# Patient Record
Sex: Female | Born: 1990 | Race: Asian | Hispanic: No | Marital: Single | Smoking: Never smoker
Health system: Southern US, Community
[De-identification: ages and names within clinical notes are randomized; demographics above are authoritative.]

## PROBLEM LIST (undated history)

## (undated) ENCOUNTER — Inpatient Hospital Stay (HOSPITAL_COMMUNITY): Payer: Self-pay

## (undated) DIAGNOSIS — Z789 Other specified health status: Secondary | ICD-10-CM

---

## 2014-10-04 ENCOUNTER — Ambulatory Visit (INDEPENDENT_AMBULATORY_CARE_PROVIDER_SITE_OTHER): Payer: Self-pay | Admitting: Family Medicine

## 2014-10-04 VITALS — BP 102/64 | HR 98 | Temp 98.4°F | Resp 16 | Ht 63.75 in | Wt 102.6 lb

## 2014-10-04 DIAGNOSIS — N926 Irregular menstruation, unspecified: Secondary | ICD-10-CM

## 2014-10-04 DIAGNOSIS — N910 Primary amenorrhea: Secondary | ICD-10-CM

## 2014-10-04 LAB — POCT URINE PREGNANCY: Preg Test, Ur: POSITIVE

## 2014-10-04 NOTE — Patient Instructions (Signed)
Congratulations on your pregnancy!  By your last period you are just under [redacted] weeks pregnant today and your due date is 06/07/2015.    You will want to apply for emergency medicaid to cover you for your pregnancy and provide you with pre- natal care. The [place]:  [facility]  Ms. Thurston HoleHeather Skeens, Director  [phone]  [address]  [address] ([address])  [place] [id number]  Tel. #[phone]  Fax # [phone] Courier Number: [id number]     Start taking pre- natal vitamins now, and please get a flu shot at the drug store when you can!

## 2014-10-04 NOTE — Progress Notes (Signed)
[facility] [address] [place] [address] [phone]  Date:  10/04/2014   Name:  Joy Stewart   DOB:  09-12-1991   MRN:  [id number]  PCP:  No PCP Per Patient    Chief Complaint: Possible Pregnancy and Late Period   History of Present Illness:  Joy Stewart is a 23 y.o. very pleasant female patient who presents with the following:  She is here today seeking a pregnancy test.  She has a 47 month old son; this will be her 2nd pregnancy.  Notes that her LMP was 9/3.  She has not noted any nausea or vomiting but she has noted some breast tenderness. She had a little painless spotting a few days ago.  This is now resolved and she has no current bleeding.  Never had any pain.   Her son is actually still in [place] where he was born.  He should arrive here in the [place] in the next couple of months. She has Medicaid for [place], but needs to get this transferred to [facility].   She is otherwise healthy.   NKDA She delivered via c-section last time.   Her boyfriend is the father of baby- he is here with her in the [place].  He is also the father of her living son.    There are no active problems to display for this patient.   History reviewed. No pertinent past medical history.  Past Surgical History  Procedure Laterality Date  . Cesarean section      History  Substance Use Topics  . Smoking status: Never Smoker   . Smokeless tobacco: Not on file  . Alcohol Use: No    History reviewed. No pertinent family history.  No Known Allergies  Medication list has been reviewed and updated.  No current outpatient prescriptions on file prior to visit.   No current facility-administered medications on file prior to visit.    Review of Systems:  As per HPI- otherwise negative.   Physical Examination: Filed Vitals:   10/04/14 0942  BP: 102/64  Pulse: 98  Temp: 98.4 F (36.9 C)  Resp: 16   Filed Vitals:   10/04/14 0942  Height: 5' 3.75" (1.619 m)  Weight: 102 lb 9.6 oz  (46.539 kg)   Body mass index is 17.76 kg/(m^2). Ideal Body Weight: Weight in (lb) to have BMI = 25: 144.2  GEN: WDWN, NAD, Non-toxic, A & O x 3, looks well, slim build HEENT: Atraumatic, Normocephalic. Neck supple. No masses, No LAD. Ears and Nose: No external deformity. CV: RRR, No M/G/R. No JVD. No thrill. No extra heart sounds. PULM: CTA B, no wheezes, crackles, rhonchi. No retractions. No resp. distress. No accessory muscle use. ABD: S, NT, ND. No rebound. No HSM. EXTR: No c/c/e NEURO Normal gait.  PSYCH: Normally interactive. Conversant. Not depressed or anxious appearing.  Calm demeanor.    Results for orders placed in visit on 10/04/14  POCT URINE PREGNANCY      Result Value Ref Range   Preg Test, Ur Positive      Assessment and Plan: Late menstruation - Plan: POCT urine pregnancy  Early pregnancy- she is 4 and 6 by her LMP.  Discussed how to get her medicaid transferred to [place] so she will have coverage for her pregnancy.  Start PNV and get a flu shot at the drug store (to save money).   If any bleeding, pain, or other complications I instructed [facility] to [facility]   Signed Abbe AmsterdamJessica Clerance Umland, MD

## 2014-10-15 ENCOUNTER — Emergency Department (HOSPITAL_COMMUNITY): Payer: Medicaid Other

## 2014-10-15 ENCOUNTER — Encounter (HOSPITAL_COMMUNITY): Payer: Self-pay | Admitting: Emergency Medicine

## 2014-10-15 ENCOUNTER — Emergency Department (HOSPITAL_COMMUNITY)
Admission: EM | Admit: 2014-10-15 | Discharge: 2014-10-15 | Disposition: A | Discharge status: Home / Self Care | Payer: Medicaid Other | Attending: Emergency Medicine | Admitting: Emergency Medicine

## 2014-10-15 DIAGNOSIS — O209 Hemorrhage in early pregnancy, unspecified: Secondary | ICD-10-CM | POA: Diagnosis present

## 2014-10-15 DIAGNOSIS — O200 Threatened abortion: Secondary | ICD-10-CM | POA: Diagnosis not present

## 2014-10-15 DIAGNOSIS — Z3A01 Less than 8 weeks gestation of pregnancy: Secondary | ICD-10-CM | POA: Insufficient documentation

## 2014-10-15 LAB — BASIC METABOLIC PANEL
Anion gap: 13 (ref 5–15)
BUN: 11 mg/dL (ref 6–23)
CALCIUM: 9 mg/dL (ref 8.4–10.5)
CO2: 25 meq/L (ref 19–32)
CREATININE: 0.48 mg/dL — AB (ref 0.50–1.10)
Chloride: 103 mEq/L (ref 96–112)
GFR calc Af Amer: >90 mL/min (ref >90)
GLUCOSE: 82 mg/dL (ref 70–99)
Potassium: 4.2 mEq/L (ref 3.7–5.3)
Sodium: 141 mEq/L (ref 137–147)

## 2014-10-15 LAB — CBC WITH DIFFERENTIAL/PLATELET
Basophils Absolute: 0 10*3/uL (ref 0.0–0.1)
Basophils Relative: 0 % (ref 0–1)
EOS PCT: 1 % (ref 0–5)
Eosinophils Absolute: 0.1 10*3/uL (ref 0.0–0.7)
HEMATOCRIT: 37.7 % (ref 36.0–46.0)
Hemoglobin: 12.8 g/dL (ref 12.0–15.0)
LYMPHS ABS: 1.6 10*3/uL (ref 0.7–4.0)
LYMPHS PCT: 30 % (ref 12–46)
MCH: 31.5 pg (ref 26.0–34.0)
MCHC: 34 g/dL (ref 30.0–36.0)
MCV: 92.9 fL (ref 78.0–100.0)
MONO ABS: 0.5 10*3/uL (ref 0.1–1.0)
MONOS PCT: 9 % (ref 3–12)
Neutro Abs: 3.1 10*3/uL (ref 1.7–7.7)
Neutrophils Relative %: 60 % (ref 43–77)
Platelets: 243 10*3/uL (ref 150–400)
RBC: 4.06 MIL/uL (ref 3.87–5.11)
RDW: 12.1 % (ref 11.5–15.5)
WBC: 5.3 10*3/uL (ref 4.0–10.5)

## 2014-10-15 LAB — HIV ANTIBODY (ROUTINE TESTING W REFLEX): HIV 1&2 Ab, 4th Generation: NONREACTIVE

## 2014-10-15 LAB — WET PREP, GENITAL
Clue Cells Wet Prep HPF POC: NONE SEEN
Trich, Wet Prep: NONE SEEN
WBC WET PREP: NONE SEEN
YEAST WET PREP: NONE SEEN

## 2014-10-15 LAB — ABO/RH: ABO/RH(D): A POS

## 2014-10-15 LAB — HCG, QUANTITATIVE, PREGNANCY: HCG, BETA CHAIN, QUANT, S: 962 m[IU]/mL — AB (ref <5)

## 2014-10-15 MED ORDER — ACETAMINOPHEN 325 MG PO TABS
650.0000 mg | ORAL_TABLET | Freq: Once | ORAL | Status: AC
  Administered 2014-10-15: 650 mg via ORAL
  Filled 2014-10-15: qty 2

## 2014-10-15 NOTE — ED Provider Notes (Signed)
CSN: [id number]     Arrival date & time 10/15/14  1426 History   First MD Initiated Contact with Patient 10/15/14 1457     Chief Complaint  Patient presents with  . 6 wks preg, vaginal bleeding      (Consider location/radiation/quality/duration/timing/severity/associated sxs/prior Treatment) Patient is a 23 y.o. female presenting with vaginal bleeding. The history is provided by the patient. No language interpreter was used.  Vaginal Bleeding Quality:  Dark red and typical of menses Severity:  Moderate Onset quality:  Sudden Duration:  15 hours Timing:  Constant Progression:  Unchanged Chronicity:  New Possible pregnancy: yes (6-7 weeks by LMP, no US yet)   Context: spontaneously   Relieved by:  Nothing Worsened by:  Nothing tried Ineffective treatments:  None tried Associated symptoms: abdominal pain   Associated symptoms: no back pain, no dizziness, no dysuria, no fatigue, no fever, no nausea and no vaginal discharge   Abdominal pain:    Location:  Suprapubic   Quality:  Aching and cramping   Severity:  Moderate   Onset quality:  Sudden   Duration:  15 hours   Timing:  Constant   Progression:  Unchanged   Chronicity:  New Risk factors: no hx of ectopic pregnancy and no prior miscarriage     History reviewed. No pertinent past medical history. Past Surgical History  Procedure Laterality Date  . Cesarean section     History reviewed. No pertinent family history. History  Substance Use Topics  . Smoking status: Never Smoker   . Smokeless tobacco: Not on file  . Alcohol Use: No   OB History   Grav Para Term Preterm Abortions TAB SAB Ect Mult Living                 Review of Systems  Constitutional: Negative for fever, chills, diaphoresis, activity change, appetite change and fatigue.  HENT: Negative for congestion, facial swelling, rhinorrhea and sore throat.   Eyes: Negative for photophobia and discharge.  Respiratory: Negative for cough, chest tightness and  shortness of breath.   Cardiovascular: Negative for chest pain, palpitations and leg swelling.  Gastrointestinal: Positive for abdominal pain. Negative for nausea, vomiting and diarrhea.  Endocrine: Negative for polydipsia and polyuria.  Genitourinary: Positive for vaginal bleeding. Negative for dysuria, frequency, vaginal discharge, difficulty urinating and pelvic pain.  Musculoskeletal: Negative for arthralgias, back pain, neck pain and neck stiffness.  Skin: Negative for color change and wound.  Allergic/Immunologic: Negative for immunocompromised state.  Neurological: Negative for dizziness, facial asymmetry, weakness, numbness and headaches.  Hematological: Does not bruise/bleed easily.  Psychiatric/Behavioral: Negative for confusion and agitation.      Allergies  Review of patient's allergies indicates no known allergies.  Home Medications   Prior to Admission medications   Not on File   BP 98/58  Pulse 83  Temp(Src) 98.2 F (36.8 C) (Oral)  Resp 18  SpO2 99% Physical Exam  Constitutional: She is oriented to person, place, and time. She appears well-developed and well-nourished. No distress.  HENT:  Head: Normocephalic and atraumatic.  Mouth/Throat: No oropharyngeal exudate.  Eyes: Pupils are equal, round, and reactive to light.  Neck: Normal range of motion. Neck supple.  Cardiovascular: Normal rate, regular rhythm and normal heart sounds.  Exam reveals no gallop and no friction rub.   No murmur heard. Pulmonary/Chest: Effort normal and breath sounds normal. No respiratory distress. She has no wheezes. She has no rales.  Abdominal: Soft. Bowel sounds are normal. She exhibits no  distension and no mass. There is no tenderness. There is no rebound and no guarding.  Genitourinary: Right adnexum displays tenderness. Left adnexum displays no mass, no tenderness and no fullness.  Os open with material in the os, moderate dark blood in the vault  Musculoskeletal: Normal range  of motion. She exhibits no edema and no tenderness.  Neurological: She is alert and oriented to person, place, and time.  Skin: Skin is warm and dry.  Psychiatric: She has a normal mood and affect.    ED Course  Procedures (including critical care time) Labs Review Labs Reviewed  BASIC METABOLIC PANEL - Abnormal; Notable for the following:    Creatinine, Ser 0.48 (*)    All other components within normal limits  HCG, QUANTITATIVE, PREGNANCY - Abnormal; Notable for the following:    hCG, Beta Chain, Quant, S 962 (*)    All other components within normal limits  WET PREP, GENITAL  URINE CULTURE  GC/CHLAMYDIA PROBE AMP  CBC WITH DIFFERENTIAL  URINALYSIS, ROUTINE W REFLEX MICROSCOPIC  HIV ANTIBODY (ROUTINE TESTING)  POC URINE PREG, ED  ABO/RH    Imaging Review [place] Ob Comp Less 14 Wks  10/15/2014   CLINICAL DATA:  Early pregnancy with bleeding.  EXAM: OBSTETRIC <14 WK US AND TRANSVAGINAL OB US  TECHNIQUE: Both transabdominal and transvaginal ultrasound examinations were performed for complete evaluation of the gestation as well as the maternal uterus, adnexal regions, and pelvic cul-de-sac. Transvaginal technique was performed to assess early pregnancy.  COMPARISON:  None.  FINDINGS: Intrauterine gestational sac: Single and grossly normal in appearance  Yolk sac:  Not visualized  Embryo:  Not visualized  MSD:  5.9  mm   5 w   2  d  US EDC: 06/15/2015  Maternal uterus/adnexae: No evidence of subchorionic hemorrhage. Right ovary 3.6 x 2.2 x 2.7 cm, normal. Left ovary 3.5 x 1.7 x 2.1 cm, normal. Normal appearing follicular cysts. No free fluid.  IMPRESSION: Normal appearing intrauterine gestational sac, 5 weeks 2 days by mean sac diameter. No visible yolk sac or fetal pole on today's exam.   Electronically Signed   By: Paulina FusiMark  Shogry M.D.   On: 10/15/2014 18:55   [place] Transvaginal Non-ob  10/15/2014   CLINICAL DATA:  Early pregnancy with bleeding.  EXAM: OBSTETRIC <14 WK US AND TRANSVAGINAL OB US   TECHNIQUE: Both transabdominal and transvaginal ultrasound examinations were performed for complete evaluation of the gestation as well as the maternal uterus, adnexal regions, and pelvic cul-de-sac. Transvaginal technique was performed to assess early pregnancy.  COMPARISON:  None.  FINDINGS: Intrauterine gestational sac: Single and grossly normal in appearance  Yolk sac:  Not visualized  Embryo:  Not visualized  MSD:  5.9  mm   5 w   2  d  US EDC: 06/15/2015  Maternal uterus/adnexae: No evidence of subchorionic hemorrhage. Right ovary 3.6 x 2.2 x 2.7 cm, normal. Left ovary 3.5 x 1.7 x 2.1 cm, normal. Normal appearing follicular cysts. No free fluid.  IMPRESSION: Normal appearing intrauterine gestational sac, 5 weeks 2 days by mean sac diameter. No visible yolk sac or fetal pole on today's exam.   Electronically Signed   By: Paulina FusiMark  Shogry M.D.   On: 10/15/2014 18:55     EKG Interpretation None      MDM   Final diagnoses:  Threatened abortion    Pt is a 23 y.o. female with Pmhx as above who presents with vaginal bleeding and crampy low abdominal pain  since about 1 AM this morning. Patient is approximately 6-[redacted] weeks pregnant by last initial period. She has seen OB on has not yet had an ultrasound. She denies fever, chills, nausea, vomiting, dysuria. On physical exam vital signs are stable and she is in no acute distress. Pelvic exam she has moderate amount of dark blood in vaginal vault. Os is open and there is bacterial in office. Exam is consistent with a inevitable spontaneous abortion. Pelvic ultrasound with yolk sac, no fetal pole, at [redacted]W[redacted]D, Rh+. Again, clinically I suspect inevitable abortion, but given only yolk sac seen, will have her f/u with her OB or women's clinic in 2 days. Return precautions given for new or worsening symptoms including worsening pain, fever. Bleeding precautions also given.          Toy Cookey, MD 10/15/14 [phone]

## 2014-10-15 NOTE — ED Notes (Signed)
Pt reminded of need for urine sample, still unable to provide sample at this time.

## 2014-10-15 NOTE — Discharge Instructions (Signed)
Threatened Miscarriage °A threatened miscarriage is when you have vaginal bleeding during your first 20 weeks of pregnancy but the pregnancy has not ended. Your doctor will do tests to make sure you are still pregnant. The cause of the bleeding may not be known. This condition does not mean your pregnancy will end. It does increase the risk of it ending (complete miscarriage). °HOME CARE  °· Make sure you keep all your doctor visits for prenatal care. °· Get plenty of rest. °· Do not have sex or use tampons if you have vaginal bleeding. °· Do not douche. °· Do not smoke or use drugs. °· Do not drink alcohol. °· Avoid caffeine. °GET HELP IF: °· You have light bleeding from your vagina. °· You have belly pain or cramping. °· You have a fever. °GET HELP RIGHT AWAY IF:  °· You have heavy bleeding from your vagina. °· You have clots of blood coming from your vagina. °· You have bad pain or cramps in your low back or belly. °· You have fever, chills, and bad belly pain. °MAKE SURE YOU:  °· Understand these instructions. °· Will watch your condition. °· Will get help right away if you are not doing well or get worse. °Document Released: 11/27/2008 Document Revised: 12/20/2013 Document Reviewed: 10/11/2013 °ExitCare® Patient Information ©2015 [employer]. This information is not intended to replace advice given to you by your health care provider. Make sure you discuss any questions you have with your health care provider. ° °

## 2014-10-15 NOTE — ED Notes (Signed)
Pt reports 2 pregnancy, 1 living child. Reports she is 5-[redacted] weeks pregnant, started having vaginal bleeding around 1400. Abdominal pain/cramps 7/10.

## 2014-10-15 NOTE — ED Notes (Signed)
Patient transported to Ultrasound 

## 2014-10-16 LAB — GC/CHLAMYDIA PROBE AMP
CT Probe RNA: NEGATIVE
GC Probe RNA: NEGATIVE

## 2014-10-17 ENCOUNTER — Inpatient Hospital Stay (HOSPITAL_COMMUNITY)
Admission: AD | Admit: 2014-10-17 | Discharge: 2014-10-17 | Disposition: A | Discharge status: Home / Self Care | Payer: Medicaid Other | Source: Ambulatory Visit | Attending: Obstetrics & Gynecology | Admitting: Obstetrics & Gynecology

## 2014-10-17 ENCOUNTER — Encounter (HOSPITAL_COMMUNITY): Payer: Self-pay | Admitting: *Deleted

## 2014-10-17 DIAGNOSIS — Z3A01 Less than 8 weeks gestation of pregnancy: Secondary | ICD-10-CM | POA: Diagnosis not present

## 2014-10-17 DIAGNOSIS — R109 Unspecified abdominal pain: Secondary | ICD-10-CM | POA: Diagnosis present

## 2014-10-17 DIAGNOSIS — O039 Complete or unspecified spontaneous abortion without complication: Secondary | ICD-10-CM | POA: Insufficient documentation

## 2014-10-17 HISTORY — DX: Other specified health status: Z78.9

## 2014-10-17 LAB — URINALYSIS, ROUTINE W REFLEX MICROSCOPIC
Bilirubin Urine: NEGATIVE
Glucose, UA: NEGATIVE mg/dL
Ketones, ur: NEGATIVE mg/dL
LEUKOCYTES UA: NEGATIVE
NITRITE: NEGATIVE
PH: 6 (ref 5.0–8.0)
Protein, ur: NEGATIVE mg/dL
SPECIFIC GRAVITY, URINE: 1.02 (ref 1.005–1.030)
UROBILINOGEN UA: 0.2 mg/dL (ref 0.0–1.0)

## 2014-10-17 LAB — URINE MICROSCOPIC-ADD ON

## 2014-10-17 LAB — CBC
HCT: 37.1 % (ref 36.0–46.0)
HEMOGLOBIN: 12.6 g/dL (ref 12.0–15.0)
MCH: 31.2 pg (ref 26.0–34.0)
MCHC: 34 g/dL (ref 30.0–36.0)
MCV: 91.8 fL (ref 78.0–100.0)
PLATELETS: 223 10*3/uL (ref 150–400)
RBC: 4.04 MIL/uL (ref 3.87–5.11)
RDW: 12 % (ref 11.5–15.5)
WBC: 5 10*3/uL (ref 4.0–10.5)

## 2014-10-17 LAB — HCG, QUANTITATIVE, PREGNANCY: hCG, Beta Chain, Quant, S: 266 m[IU]/mL — ABNORMAL HIGH (ref <5)

## 2014-10-17 NOTE — MAU Provider Note (Signed)
History     CSN: [id number]  Arrival date and time: 10/17/14 1101   First Provider Initiated Contact with Patient 10/17/14 1141      Chief Complaint  Patient presents with  . Vaginal Bleeding  . Abdominal Pain   Vaginal Bleeding Associated symptoms include headaches. Pertinent negatives include no abdominal pain.  Abdominal Pain Associated symptoms include headaches.    Pt is a G2P1001 at 3011w4d wks IUP here with report of increased abdominal pain and bleeding yesterday.  Pt reports passing a clot yesterday.  Pain and bleeding has decreased today.   [facility] for pelvic pain and bleeding.  Ultrasound showed an IUGS, but not yolk sac.  BHCG was 962.    Past Medical History  Diagnosis Date  . Medical history non-contributory     Past Surgical History  Procedure Laterality Date  . Cesarean section      Family History  Problem Relation Age of Onset  . Hypertension Father     History  Substance Use Topics  . Smoking status: Never Smoker   . Smokeless tobacco: Not on file  . Alcohol Use: No    Allergies: No Known Allergies  No prescriptions prior to admission    Review of Systems  Gastrointestinal: Negative for abdominal pain.  Genitourinary: Positive for vaginal bleeding.       Vaginal bleeding  Neurological: Positive for headaches.  All other systems reviewed and are negative.  Physical Exam   Blood pressure 111/65, pulse 86, temperature 97.7 F (36.5 C), resp. rate 18, height 5' 3.5" (1.613 m), weight 45.632 kg (100 lb 9.6 oz).  Physical Exam  Constitutional: She is oriented to person, place, and time. She appears well-developed and well-nourished. No distress.  HENT:  Head: Normocephalic.  Neck: Normal range of motion. Neck supple.  Cardiovascular: Normal rate, regular rhythm and normal heart sounds.   Respiratory: Effort normal and breath sounds normal. No respiratory distress.  GI: Soft. There is no tenderness.   Genitourinary: Uterus is enlarged. Right adnexum displays no mass, no tenderness and no fullness. Left adnexum displays no mass, no tenderness and no fullness. There is bleeding (scant) around the vagina.  Musculoskeletal: Normal range of motion.  Neurological: She is alert and oriented to person, place, and time.  Skin: Skin is warm and dry.    MAU Course  Procedures  Results for orders placed during the hospital encounter of 10/17/14 (from the past 24 hour(s))  URINALYSIS, ROUTINE W REFLEX MICROSCOPIC     Status: Abnormal   Collection Time    10/17/14 11:15 AM      Result Value Ref Range   Color, Urine YELLOW  YELLOW   APPearance HAZY (*) CLEAR   Specific Gravity, Urine 1.020  1.005 - 1.030   pH 6.0  5.0 - 8.0   Glucose, UA NEGATIVE  NEGATIVE mg/dL   Hgb urine dipstick LARGE (*) NEGATIVE   Bilirubin Urine NEGATIVE  NEGATIVE   Ketones, ur NEGATIVE  NEGATIVE mg/dL   Protein, ur NEGATIVE  NEGATIVE mg/dL   Urobilinogen, UA 0.2  0.0 - 1.0 mg/dL   Nitrite NEGATIVE  NEGATIVE   Leukocytes, UA NEGATIVE  NEGATIVE  URINE MICROSCOPIC-ADD ON     Status: Abnormal   Collection Time    10/17/14 11:15 AM      Result Value Ref Range   Squamous Epithelial / LPF FEW (*) RARE   RBC / HPF 21-50  <3 RBC/hpf   Bacteria,  UA FEW (*) RARE   Urine-Other MUCOUS PRESENT    CBC     Status: None   Collection Time    10/17/14 11:35 AM      Result Value Ref Range   WBC 5.0  4.0 - 10.5 K/uL   RBC 4.04  3.87 - 5.11 MIL/uL   Hemoglobin 12.6  12.0 - 15.0 g/dL   HCT 78.237.1  95.636.0 - 21.346.0 %   MCV 91.8  78.0 - 100.0 fL   MCH 31.2  26.0 - 34.0 pg   MCHC 34.0  30.0 - 36.0 g/dL   RDW 08.612.0  57.811.5 - 46.915.5 %   Platelets 223  150 - 400 K/uL  HCG, QUANTITATIVE, PREGNANCY     Status: Abnormal   Collection Time    10/17/14 11:35 AM      Result Value Ref Range   hCG, Beta Chain, Quant, S 266 (*) <5 mIU/mL    Assessment and Plan  Spontaneous Abortion  Plan: Consulted with Dr. Macon LargeAnyanwu > reviewed HPI/exam/labs >  repeat BHCG in one week in clinic  Reviewed bleeding/ectopic precautions Follow-up in clinic in one week  KARIM, [facility] Home[facility] N 10/17/2014, 11:45 AM

## 2014-10-17 NOTE — Discharge Instructions (Signed)
Miscarriage °A miscarriage is the loss of an unborn baby (fetus) before the 20th week of pregnancy. The cause is often unknown.  °HOME CARE °· You may need to stay in bed (bed rest), or you may be able to do light activity. Go about activity as told by your doctor. °· Have help at home. °· Write down how many pads you use each day. Write down how soaked they are. °· Do not use tampons. Do not wash out your vagina (douche) or have sex (intercourse) until your doctor approves. °· Only take medicine as told by your doctor. °· Do not take aspirin. °· Keep all doctor visits as told. °· If you or your partner have problems with grieving, talk to your doctor. You can also try counseling. Give yourself time to grieve before trying to get pregnant again. °GET HELP RIGHT AWAY IF: °· You have bad cramps or pain in your back or belly (abdomen). °· You have a fever. °· You pass large clumps of blood (clots) from your vagina that are walnut-sized or larger. Save the clumps for your doctor to see. °· You pass large amounts of tissue from your vagina. Save the tissue for your doctor to see. °· You have more bleeding. °· You have thick, bad-smelling fluid (discharge) coming from the vagina. °· You get lightheaded, weak, or you pass out (faint). °· You have chills. °MAKE SURE YOU: °· Understand these instructions. °· Will watch your condition. °· Will get help right away if you are not doing well or get worse. °Document Released: 03/08/2012 Document Reviewed: 03/08/2012 °ExitCare® Patient Information ©2015 Exit[employer], [employer]. This information is not intended to replace advice given to you by your health care provider. Make sure you discuss any questions you have with your health care provider. ° °

## 2014-10-17 NOTE — MAU Note (Signed)
E -sig not working and computer shut down. Pt was unable to sign.

## 2014-10-17 NOTE — Progress Notes (Signed)
W.Karim CNM in to discuss test results and d/c plan. Written and verbal d/c instructions given and understanding voiced. Comfort info. Given with pillow.

## 2014-10-17 NOTE — MAU Provider Note (Signed)
Attestation of Attending Supervision of Advanced Practitioner (PA/CNM/NP): Evaluation and management procedures were performed by the Advanced Practitioner under my supervision and collaboration.  I have reviewed the Advanced Practitioner's note and chart, and I agree with the management and plan.  Freda Jaquith, MD, FACOG Attending Obstetrician & Gynecologist Faculty Practice, [facility]   

## 2014-10-17 NOTE — MAU Note (Signed)
Was here 2 days ago. Cont to have vag bleeding and abd pain. Abd cramping that comes and goes. Bleeding is dark red

## 2014-10-17 NOTE — Progress Notes (Signed)
Spec exam only 

## 2014-10-24 ENCOUNTER — Other Ambulatory Visit: Payer: Medicaid Other | Admitting: General Practice

## 2014-10-24 ENCOUNTER — Telehealth: Payer: Self-pay

## 2014-10-24 DIAGNOSIS — O039 Complete or unspecified spontaneous abortion without complication: Secondary | ICD-10-CM

## 2014-10-24 LAB — HCG, QUANTITATIVE, PREGNANCY: hCG, Beta Chain, Quant, S: 10.11 m[IU]/mL

## 2014-10-24 NOTE — Progress Notes (Signed)
Patient here today for stat b-hcg for follow up of spontaneous miscarriage. Seen in MAU on 10/20. Patient denies bleeding or pain since then. Informed patient we will call her with results and any follow up if needed. Patient verbalizes understanding of plan

## 2014-10-24 NOTE — Telephone Encounter (Signed)
Received call from [facility] reporting stat HCG of 10.11. Discussed results with Rochele PagesWalidah Karim, CNM who states patient should return in 1 week for repeat HCG. Called patient and informed her of results. Patient to return in 1 week November 3 at 0900. Message sent to admin pool to add to lab schedule.

## 2014-10-31 ENCOUNTER — Encounter (HOSPITAL_COMMUNITY): Payer: Self-pay | Admitting: *Deleted

## 2014-10-31 ENCOUNTER — Other Ambulatory Visit: Payer: Medicaid Other

## 2014-10-31 ENCOUNTER — Telehealth: Payer: Self-pay

## 2014-10-31 NOTE — Telephone Encounter (Signed)
Patient here for lab draw today-- was supposed to have repeat HCG but 3hr gtt was done instead. Attempted to contact patient to return for repeat HCG. No voicemail box set up. Unable to leave message.

## 2014-11-01 NOTE — Telephone Encounter (Signed)
Called patient and apologized and informed her of the mistake that the wrong lab had been drawn yesterday. Patient verbalized understanding and stated that she is out of town and will be back Monday. States she can come in Monday morning at 1000 for repeat HCG. Informed patient we would add her to the schedule. Patient verbalized understanding and gratitude. NO questions or concerns.

## 2014-11-06 ENCOUNTER — Other Ambulatory Visit: Payer: Medicaid Other

## 2014-11-06 DIAGNOSIS — N939 Abnormal uterine and vaginal bleeding, unspecified: Secondary | ICD-10-CM

## 2014-11-07 LAB — HCG, QUANTITATIVE, PREGNANCY: hCG, Beta Chain, Quant, S: <2 m[IU]/mL

## 2014-11-08 ENCOUNTER — Telehealth: Payer: Self-pay

## 2014-11-08 NOTE — Telephone Encounter (Signed)
-----   Message from Tereso NewcomerUgonna A Anyanwu, MD sent at 11/08/2014  8:27 AM EST ----- Completed SAB, no further lab draws needed. Can have an appointment with us or Health Department for annual exam and to discuss contraception if desired.

## 2014-11-08 NOTE — Telephone Encounter (Signed)
Called patient with results. Patient verbalized understanding and states she does not desire birth control at this time and would not like to schedule an appointment. Advised that should she decide she wants to she can schedule an appointment at the health department or clinic. Patient verbalized understanding and gratitude. No questions or concerns.

## 2015-01-18 IMAGING — US US TRANSVAGINAL NON-OB
1 series · 14 of 25 positions shown · non-contrast
Comparison: None.

CLINICAL DATA: Early pregnancy with bleeding.

EXAM:
OBSTETRIC <14 WK US AND TRANSVAGINAL OB US
TECHNIQUE: Both transabdominal and transvaginal ultrasound examinations were
performed for complete evaluation of the gestation as well as the
maternal uterus, adnexal regions, and pelvic cul-de-sac.
Transvaginal technique was performed to assess early pregnancy.

[Series 1: us transvaginal non-ob · 0.22mm/px · 14 of 52 slices shown]
[im 1/52]
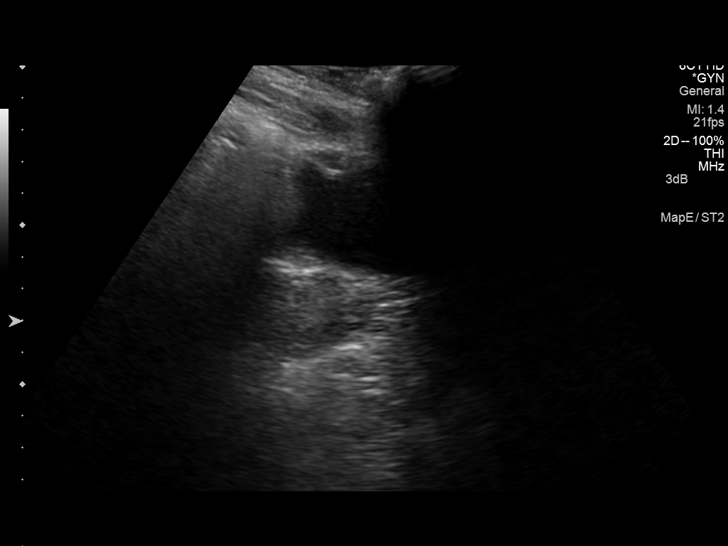
[im 5/52]
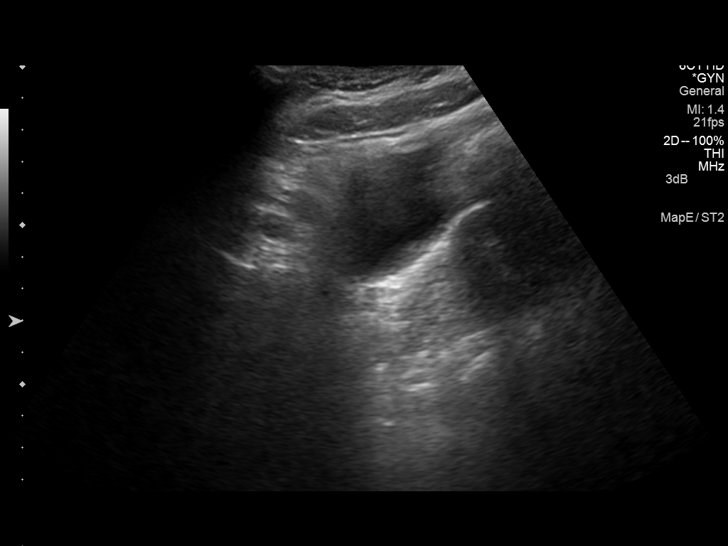
[im 9/52]
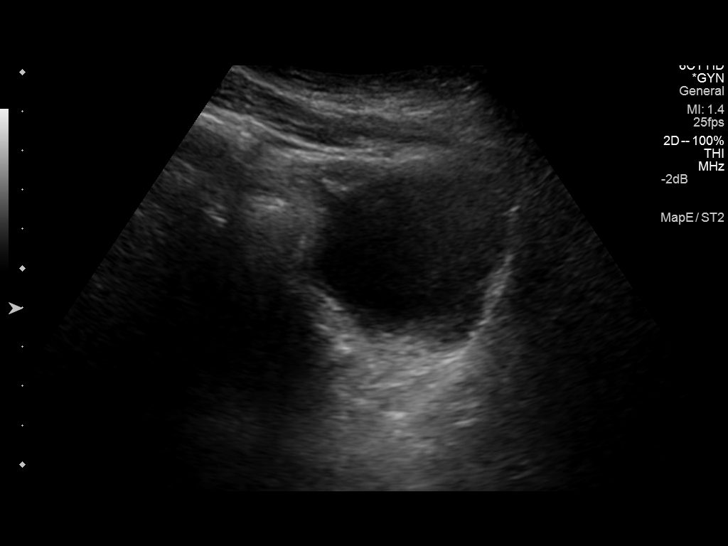
[im 13/52]
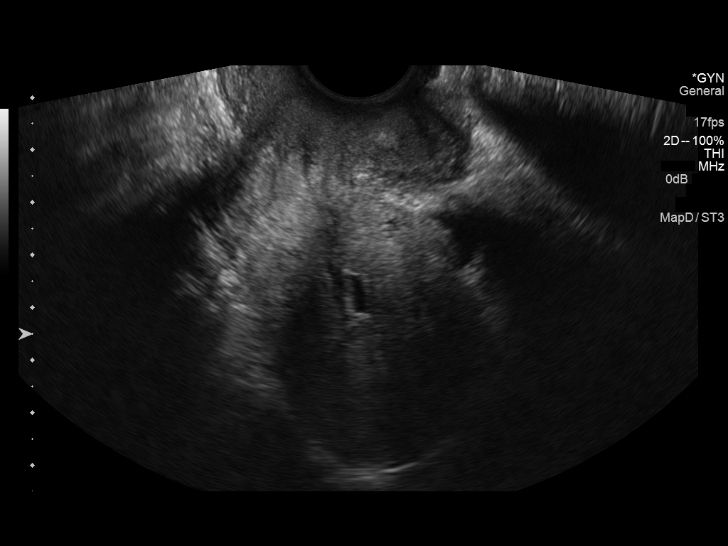
[im 18/52]
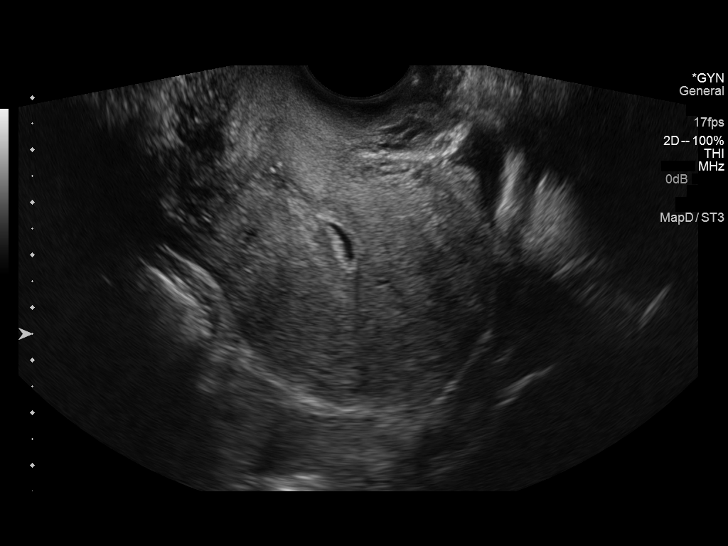
[im 20/52]
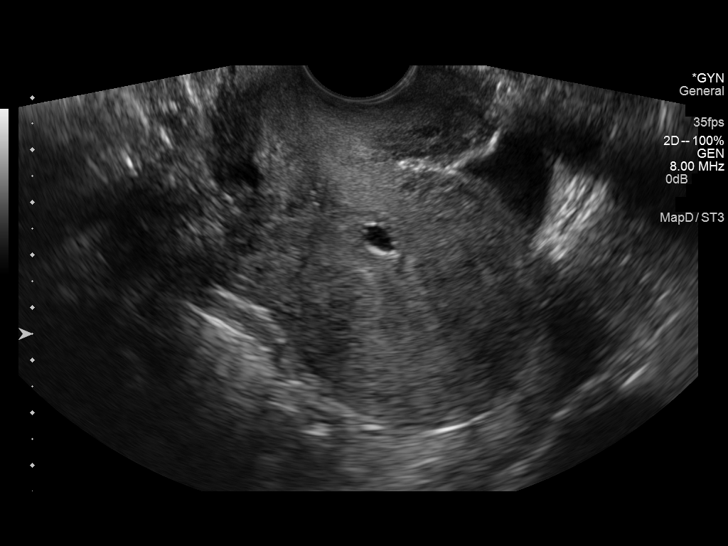
[im 24/52]
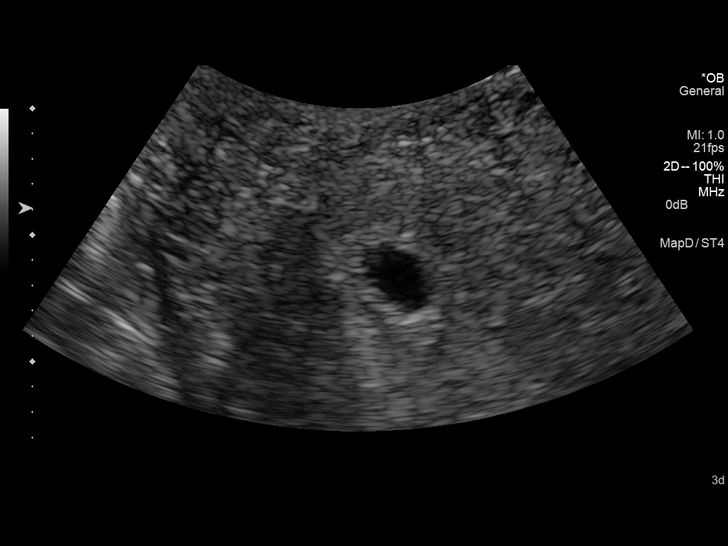
[im 28/52]
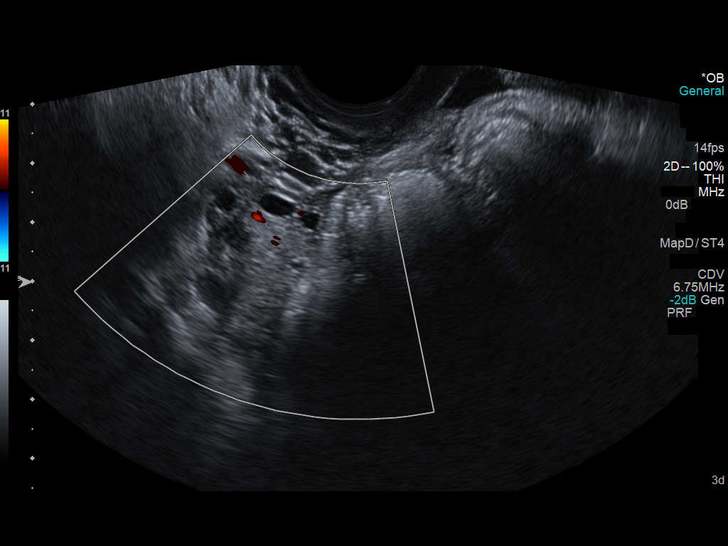
[im 32/52]
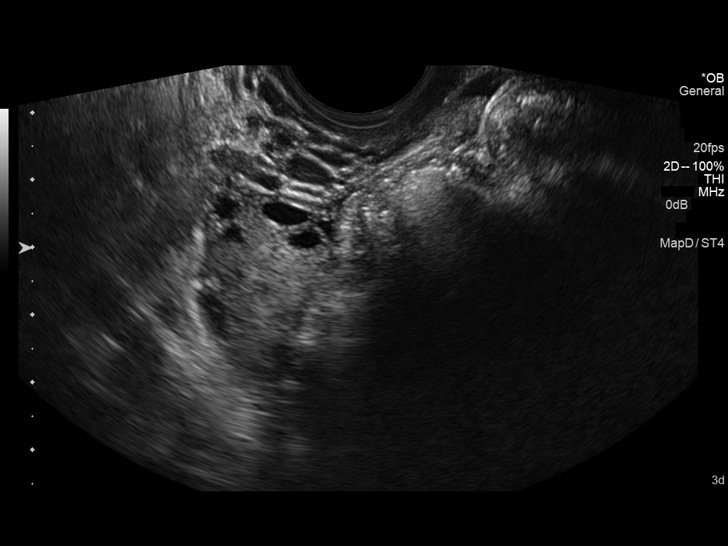
[im 35/52]
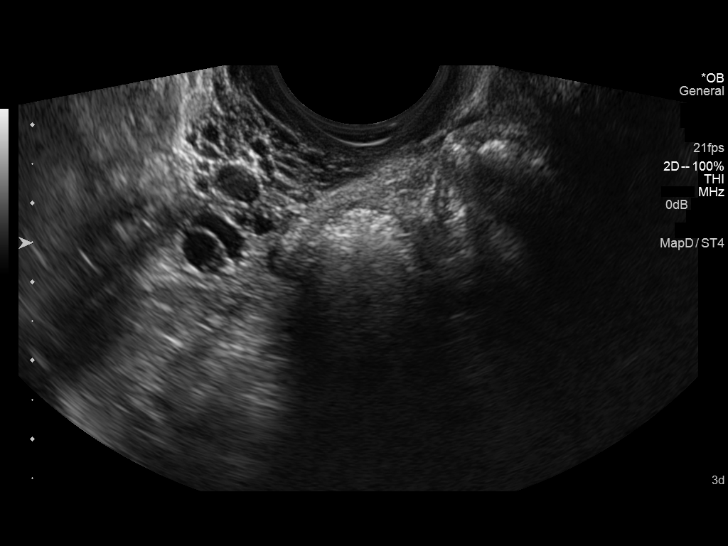
[im 39/52]
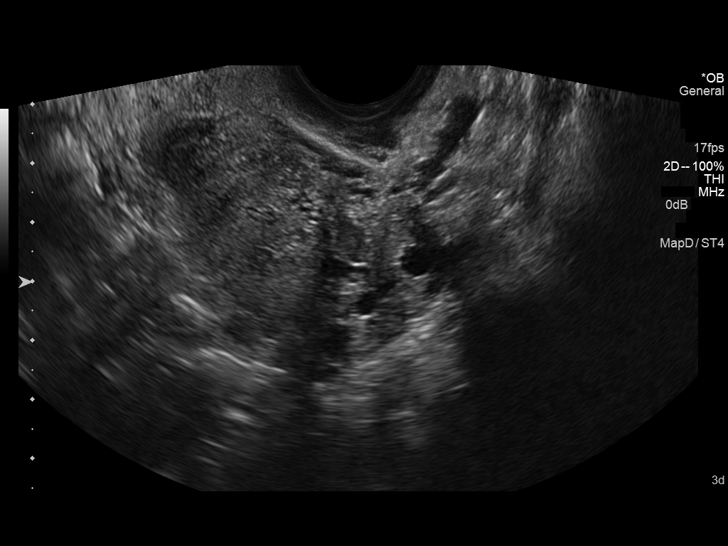
[im 43/52]
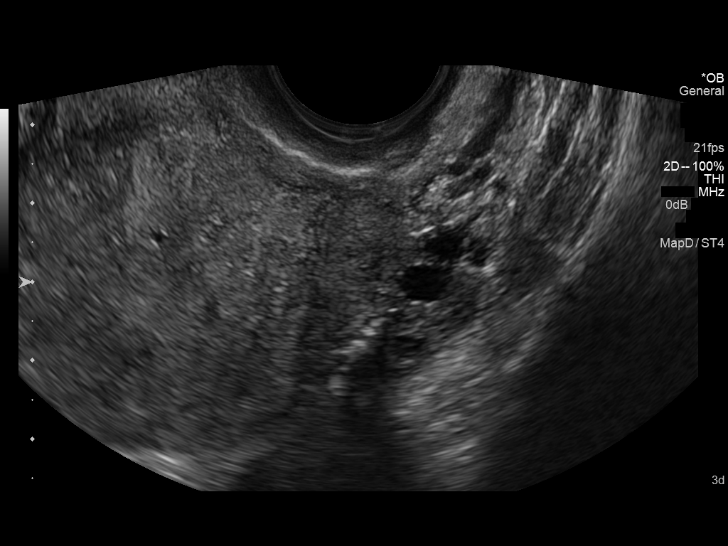
[im 47/52]
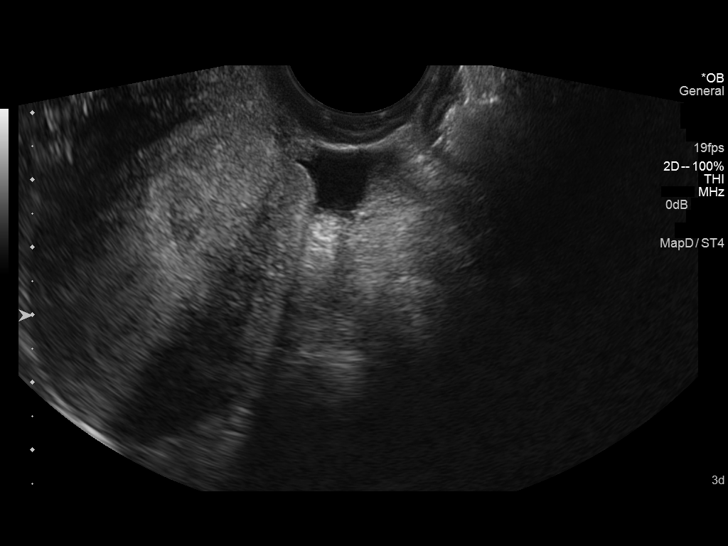
[im 52/52]
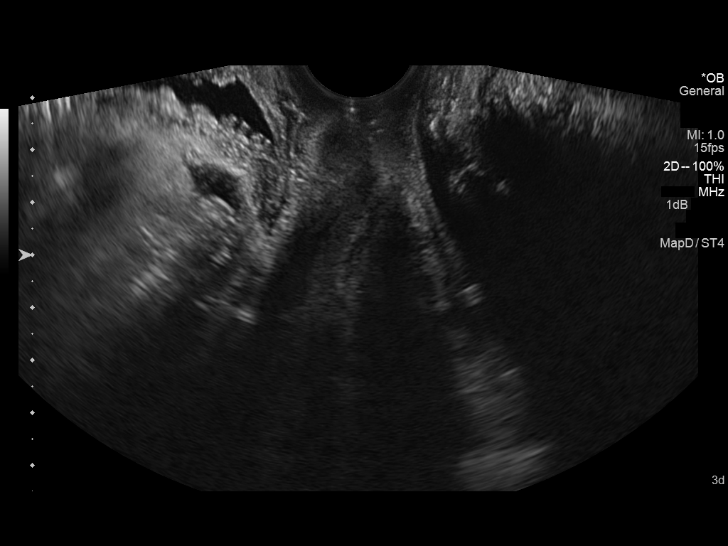

[14 of 25 positions shown; findings below may reference images not displayed]

FINDINGS: Intrauterine gestational sac: Single and grossly normal in
appearance

Yolk sac:  Not visualized

Embryo:  Not visualized

MSD:  5.9  mm   5 w   2  d

US EDC: 06/15/2015

Maternal uterus/adnexae: No evidence of subchorionic hemorrhage.
Right ovary 3.6 x 2.2 x 2.7 cm, normal. Left ovary 3.5 x 1.7 x
cm, normal. Normal appearing follicular cysts. No free fluid.
IMPRESSION: Normal appearing intrauterine gestational sac, 5 weeks 2 days by
mean sac diameter. No visible yolk sac or fetal pole on today's
exam.

## 2015-08-21 ENCOUNTER — Encounter (HOSPITAL_COMMUNITY): Payer: Self-pay | Admitting: *Deleted

## 2015-08-21 ENCOUNTER — Inpatient Hospital Stay (HOSPITAL_COMMUNITY): Payer: Medicaid Other

## 2015-08-21 ENCOUNTER — Inpatient Hospital Stay (HOSPITAL_COMMUNITY)
Admission: AD | Admit: 2015-08-21 | Discharge: 2015-08-21 | Disposition: A | Discharge status: Home / Self Care | Payer: Medicaid Other | Source: Ambulatory Visit | Attending: Obstetrics & Gynecology | Admitting: Obstetrics & Gynecology

## 2015-08-21 DIAGNOSIS — O26891 Other specified pregnancy related conditions, first trimester: Secondary | ICD-10-CM | POA: Diagnosis present

## 2015-08-21 DIAGNOSIS — O26851 Spotting complicating pregnancy, first trimester: Secondary | ICD-10-CM | POA: Diagnosis not present

## 2015-08-21 DIAGNOSIS — R1030 Lower abdominal pain, unspecified: Secondary | ICD-10-CM | POA: Diagnosis not present

## 2015-08-21 DIAGNOSIS — O9989 Other specified diseases and conditions complicating pregnancy, childbirth and the puerperium: Secondary | ICD-10-CM

## 2015-08-21 DIAGNOSIS — O26899 Other specified pregnancy related conditions, unspecified trimester: Secondary | ICD-10-CM

## 2015-08-21 DIAGNOSIS — O3680X0 Pregnancy with inconclusive fetal viability, not applicable or unspecified: Secondary | ICD-10-CM

## 2015-08-21 DIAGNOSIS — Z3A01 Less than 8 weeks gestation of pregnancy: Secondary | ICD-10-CM | POA: Insufficient documentation

## 2015-08-21 DIAGNOSIS — R109 Unspecified abdominal pain: Secondary | ICD-10-CM

## 2015-08-21 LAB — CBC
HEMATOCRIT: 36.8 % (ref 36.0–46.0)
Hemoglobin: 12.5 g/dL (ref 12.0–15.0)
MCH: 31.3 pg (ref 26.0–34.0)
MCHC: 34 g/dL (ref 30.0–36.0)
MCV: 92.2 fL (ref 78.0–100.0)
Platelets: 234 10*3/uL (ref 150–400)
RBC: 3.99 MIL/uL (ref 3.87–5.11)
RDW: 12.4 % (ref 11.5–15.5)
WBC: 7 10*3/uL (ref 4.0–10.5)

## 2015-08-21 LAB — URINALYSIS, ROUTINE W REFLEX MICROSCOPIC
BILIRUBIN URINE: NEGATIVE
Glucose, UA: NEGATIVE mg/dL
KETONES UR: NEGATIVE mg/dL
Leukocytes, UA: NEGATIVE
Nitrite: NEGATIVE
PROTEIN: NEGATIVE mg/dL
Specific Gravity, Urine: 1.025 (ref 1.005–1.030)
UROBILINOGEN UA: 0.2 mg/dL (ref 0.0–1.0)
pH: 7.5 (ref 5.0–8.0)

## 2015-08-21 LAB — WET PREP, GENITAL
Clue Cells Wet Prep HPF POC: NONE SEEN
Trich, Wet Prep: NONE SEEN
Yeast Wet Prep HPF POC: NONE SEEN

## 2015-08-21 LAB — URINE MICROSCOPIC-ADD ON

## 2015-08-21 LAB — HCG, QUANTITATIVE, PREGNANCY: hCG, Beta Chain, Quant, S: 499 m[IU]/mL — ABNORMAL HIGH (ref <5)

## 2015-08-21 LAB — POCT PREGNANCY, URINE: PREG TEST UR: POSITIVE — AB

## 2015-08-21 NOTE — Discharge Instructions (Signed)
First Trimester of Pregnancy The first trimester of pregnancy is from week 1 until the end of week 12 (months 1 through 3). During this time, your baby will begin to develop inside you. At 6-8 weeks, the eyes and face are formed, and the heartbeat can be seen on ultrasound. At the end of 12 weeks, all the baby's organs are formed. Prenatal care is all the medical care you receive before the birth of your baby. Make sure you get good prenatal care and follow all of your doctor's instructions. HOME CARE  Medicines  Take medicine only as told by your doctor. Some medicines are safe and some are not during pregnancy.  Take your prenatal vitamins as told by your doctor.  Take medicine that helps you poop (stool softener) as needed if your doctor says it is okay. Diet  Eat regular, healthy meals.  Your doctor will tell you the amount of weight gain that is right for you.  Avoid raw meat and uncooked cheese.  If you feel sick to your stomach (nauseous) or throw up (vomit):  Eat 4 or 5 small meals a day instead of 3 large meals.  Try eating a few soda crackers.  Drink liquids between meals instead of during meals.  If you have a hard time pooping (constipation):  Eat high-fiber foods like fresh vegetables, fruit, and whole grains.  Drink enough fluids to keep your pee (urine) clear or pale yellow. Activity and Exercise  Exercise only as told by your doctor. Stop exercising if you have cramps or pain in your lower belly (abdomen) or low back.  Try to avoid standing for long periods of time. Move your legs often if you must stand in one place for a long time.  Avoid heavy lifting.  Wear low-heeled shoes. Sit and stand up straight.  You can have sex unless your doctor tells you not to. Relief of Pain or Discomfort  Wear a good support bra if your breasts are sore.  Take warm water baths (sitz baths) to soothe pain or discomfort caused by hemorrhoids. Use hemorrhoid cream if your  doctor says it is okay.  Rest with your legs raised if you have leg cramps or low back pain.  Wear support hose if you have puffy, bulging veins (varicose veins) in your legs. Raise (elevate) your feet for 15 minutes, 3-4 times a day. Limit salt in your diet. Prenatal Care  Schedule your prenatal visits by the twelfth week of pregnancy.  Write down your questions. Take them to your prenatal visits.  Keep all your prenatal visits as told by your doctor. Safety  Wear your seat belt at all times when driving.  Make a list of emergency phone numbers. The list should include numbers for family, friends, the hospital, and police and fire departments. General Tips  Ask your doctor for a referral to a local prenatal class. Begin classes no later than at the start of month 6 of your pregnancy.  Ask for help if you need counseling or help with nutrition. Your doctor can give you advice or tell you where to go for help.  Do not use hot tubs, steam rooms, or saunas.  Do not douche or use tampons or scented sanitary pads.  Do not cross your legs for long periods of time.  Avoid litter boxes and soil used by cats.  Avoid all smoking, herbs, and alcohol. Avoid drugs not approved by your doctor.  Visit your dentist. At home, brush your teeth   with a soft toothbrush. Be gentle when you floss. GET HELP IF:  You are dizzy.  You have mild cramps or pressure in your lower belly.  You have a nagging pain in your belly area.  You continue to feel sick to your stomach, throw up, or have watery poop (diarrhea).  You have a bad smelling fluid coming from your vagina.  You have pain with peeing (urination).  You have increased puffiness (swelling) in your face, hands, legs, or ankles. GET HELP RIGHT AWAY IF:   You have a fever.  You are leaking fluid from your vagina.  You have spotting or bleeding from your vagina.  You have very bad belly cramping or pain.  You gain or lose weight  rapidly.  You throw up blood. It may look like coffee grounds.  You are around people who have German measles, fifth disease, or chickenpox.  You have a very bad headache.  You have shortness of breath.  You have any kind of trauma, such as from a fall or a car accident. Document Released: 06/02/2008 Document Revised: 05/01/2014 Document Reviewed: 10/25/2013 ExitCare Patient Information 2015 [employer]. This information is not intended to replace advice given to you by your health care provider. Make sure you discuss any questions you have with your health care provider.  

## 2015-08-21 NOTE — MAU Note (Signed)
Pos HPT last week, having lower abd pain since yesterday, also started spotting yesterday.

## 2015-08-21 NOTE — MAU Provider Note (Signed)
History     CSN: [id number]  Arrival date and time: 08/21/15 1722   First Provider Initiated Contact with Patient 08/21/15 2030      Chief Complaint  Patient presents with  . Abdominal Pain  . Vaginal Bleeding   HPI  Joy Stewart is a 24 y.o. [phone] at [redacted]w[redacted]d who presents to MAU today with complaint of intermittent lower abdominal pain and spotting since last night. The patient rates pain at 2/10 now, but states that last night it was 8/10. She states that pain comes and goes. She has not taken any pain medication. She denies fever, N/V/D, UTI symptoms or recent intercourse.   OB History    Gravida Para Term Preterm AB TAB SAB Ectopic Multiple Living   4 1 1  2  2   1       Past Medical History  Diagnosis Date  . Medical history non-contributory     Past Surgical History  Procedure Laterality Date  . Cesarean section      Family History  Problem Relation Age of Onset  . Hypertension Father     Social History  Substance Use Topics  . Smoking status: Never Smoker   . Smokeless tobacco: None  . Alcohol Use: No    Allergies: No Known Allergies  No prescriptions prior to admission    Review of Systems  Constitutional: Negative for fever and malaise/fatigue.  Gastrointestinal: Positive for abdominal pain. Negative for nausea, vomiting, diarrhea and constipation.  Genitourinary: Negative for dysuria, urgency and frequency.       + spotting Neg - vaginal discharge   Physical Exam   Blood pressure 95/72, pulse 100, temperature 98.4 F (36.9 C), temperature source Oral, resp. rate 16, height 5\' 2"  (1.575 m), weight 104 lb 3.2 oz (47.265 kg), last menstrual period 07/24/2015, SpO2 100 %, not currently breastfeeding.  Physical Exam  Nursing note and vitals reviewed. Constitutional: She is oriented to person, place, and time. She appears well-developed and well-nourished. No distress.  HENT:  Head: Normocephalic and atraumatic.  Cardiovascular: Normal rate.    Respiratory: Effort normal.  GI: Soft. She exhibits no distension and no mass. There is no tenderness. There is no rebound and no guarding.  Genitourinary: Uterus is enlarged (slightly) and tender (mild). Cervix exhibits no motion tenderness, no discharge and no friability. Right adnexum displays tenderness (mild). Right adnexum displays no mass. Left adnexum displays no mass and no tenderness. No bleeding in the vagina. Vaginal discharge (scant mucus discharge) found.  Neurological: She is alert and oriented to person, place, and time.  Skin: Skin is warm and dry. No erythema.  Psychiatric: She has a normal mood and affect.   Results for orders placed or performed during the hospital encounter of 08/21/15 (from the past 24 hour(s))  Urinalysis, Routine w reflex microscopic (not at [facility])     Status: Abnormal   Collection Time: 08/21/15  6:15 PM  Result Value Ref Range   Color, Urine YELLOW YELLOW   APPearance CLEAR CLEAR   Specific Gravity, Urine 1.025 1.005 - 1.030   pH 7.5 5.0 - 8.0   Glucose, UA NEGATIVE NEGATIVE mg/dL   Hgb urine dipstick TRACE (A) NEGATIVE   Bilirubin Urine NEGATIVE NEGATIVE   Ketones, ur NEGATIVE NEGATIVE mg/dL   Protein, ur NEGATIVE NEGATIVE mg/dL   Urobilinogen, UA 0.2 0.0 - 1.0 mg/dL   Nitrite NEGATIVE NEGATIVE   Leukocytes, UA NEGATIVE NEGATIVE  Urine microscopic-add on     Status: Abnormal  Collection Time: 08/21/15  6:15 PM  Result Value Ref Range   Squamous Epithelial / LPF RARE RARE   RBC / HPF 0-2 <3 RBC/hpf   Bacteria, UA MANY (A) RARE  Pregnancy, urine POC     Status: Abnormal   Collection Time: 08/21/15  6:18 PM  Result Value Ref Range   Preg Test, Ur POSITIVE (A) NEGATIVE  CBC     Status: None   Collection Time: 08/21/15  8:05 PM  Result Value Ref Range   WBC 7.0 4.0 - 10.5 K/uL   RBC 3.99 3.87 - 5.11 MIL/uL   Hemoglobin 12.5 12.0 - 15.0 g/dL   HCT 40.9 81.1 - 91.4 %   MCV 92.2 78.0 - 100.0 fL   MCH 31.3 26.0 - 34.0 pg   MCHC 34.0 30.0  - 36.0 g/dL   RDW 78.2 95.6 - 21.3 %   Platelets 234 150 - 400 K/uL  hCG, quantitative, pregnancy     Status: Abnormal   Collection Time: 08/21/15  8:05 PM  Result Value Ref Range   hCG, Beta Chain, Quant, S 499 (H) <5 mIU/mL  Wet prep, genital     Status: Abnormal   Collection Time: 08/21/15  8:40 PM  Result Value Ref Range   Yeast Wet Prep HPF POC NONE SEEN NONE SEEN   Trich, Wet Prep NONE SEEN NONE SEEN   Clue Cells Wet Prep HPF POC NONE SEEN NONE SEEN   WBC, Wet Prep HPF POC FEW (A) NONE SEEN   US Ob Comp Less 14 Wks  08/21/2015   CLINICAL DATA:  Low abdominal pain for 2 days. Spotting. Quantitative beta HCG level 499. Patient [redacted] weeks pregnant based on her last menstrual period.  EXAM: OBSTETRIC <14 WK [place] AND TRANSVAGINAL OB US  TECHNIQUE: Both transabdominal and transvaginal ultrasound examinations were performed for complete evaluation of the gestation as well as the maternal uterus, adnexal regions, and pelvic cul-de-sac. Transvaginal technique was performed to assess early pregnancy.  COMPARISON:  None.  FINDINGS: Intrauterine gestational sac: Not visualized  Yolk sac:  No  Embryo:  No  Maternal uterus/adnexae: Uterus is retroverted. No uterine masses. Endometrium is thin and smooth. Ovaries are unremarkable. No adnexal masses.  There is a small amount pelvic free fluid somewhat greater than generally seen for physiologic fluid.  IMPRESSION: 1. No sonographic evidence of an intrauterine pregnancy. 2. No ectopic pregnancy seen. 3. Small amount pelvic free fluid. Given the low beta HCG level, findings still could reflect an early intrauterine pregnancy not visible at this stage. Ectopic pregnancy is also not excluded. Findings may reflect a completed miscarriage. Recommend follow-up serial beta HCG levels and repeat sonographic imaging in 7-10 days.   Electronically Signed   By: Amie Portland M.D.   On: 08/21/2015 21:36   US Ob Transvaginal  08/21/2015   CLINICAL DATA:  Low abdominal pain  for 2 days. Spotting. Quantitative beta HCG level 499. Patient [redacted] weeks pregnant based on her last menstrual period.  EXAM: OBSTETRIC <14 WK [place] AND TRANSVAGINAL OB US  TECHNIQUE: Both transabdominal and transvaginal ultrasound examinations were performed for complete evaluation of the gestation as well as the maternal uterus, adnexal regions, and pelvic cul-de-sac. Transvaginal technique was performed to assess early pregnancy.  COMPARISON:  None.  FINDINGS: Intrauterine gestational sac: Not visualized  Yolk sac:  No  Embryo:  No  Maternal uterus/adnexae: Uterus is retroverted. No uterine masses. Endometrium is thin and smooth. Ovaries are unremarkable. No adnexal masses.  There is a small amount pelvic free fluid somewhat greater than generally seen for physiologic fluid.  IMPRESSION: 1. No sonographic evidence of an intrauterine pregnancy. 2. No ectopic pregnancy seen. 3. Small amount pelvic free fluid. Given the low beta HCG level, findings still could reflect an early intrauterine pregnancy not visible at this stage. Ectopic pregnancy is also not excluded. Findings may reflect a completed miscarriage. Recommend follow-up serial beta HCG levels and repeat sonographic imaging in 7-10 days.   Electronically Signed   By: Amie Portland M.D.   On: 08/21/2015 21:36    MAU Course  Procedures None  MDM +UPT UA, wet prep, GC/chlamydia, CBC, quant hCG, HIV, RPR and [place] today to rule out ectopic pregnancy Urine culture pending  Assessment and Plan  A: Pregnancy of unknown anatomic location Abdominal pain in pregnancy Spotting in first trimester  P: Discharge home Tylenol PRN for pain advised Ectopic/bleeding precautions discussed Patient advised to follow-up in MAU in 48 hours for repeat labs or sooner if her condition were to change or worsen   Marny Lowenstein, PA-C  08/21/2015, 9:53 PM

## 2015-08-22 LAB — HIV ANTIBODY (ROUTINE TESTING W REFLEX): HIV SCREEN 4TH GENERATION: NONREACTIVE

## 2015-08-23 ENCOUNTER — Inpatient Hospital Stay (HOSPITAL_COMMUNITY)
Admission: AD | Admit: 2015-08-23 | Discharge: 2015-08-23 | Disposition: A | Discharge status: Home / Self Care | Payer: Medicaid Other | Source: Ambulatory Visit | Attending: Family Medicine | Admitting: Family Medicine

## 2015-08-23 ENCOUNTER — Encounter (HOSPITAL_COMMUNITY): Payer: Self-pay | Admitting: Family

## 2015-08-23 DIAGNOSIS — O3680X0 Pregnancy with inconclusive fetal viability, not applicable or unspecified: Secondary | ICD-10-CM

## 2015-08-23 DIAGNOSIS — Z3A01 Less than 8 weeks gestation of pregnancy: Secondary | ICD-10-CM | POA: Insufficient documentation

## 2015-08-23 DIAGNOSIS — O26891 Other specified pregnancy related conditions, first trimester: Secondary | ICD-10-CM | POA: Insufficient documentation

## 2015-08-23 DIAGNOSIS — O9989 Other specified diseases and conditions complicating pregnancy, childbirth and the puerperium: Secondary | ICD-10-CM

## 2015-08-23 DIAGNOSIS — R109 Unspecified abdominal pain: Secondary | ICD-10-CM

## 2015-08-23 LAB — CULTURE, OB URINE

## 2015-08-23 LAB — HCG, QUANTITATIVE, PREGNANCY: HCG, BETA CHAIN, QUANT, S: 995 m[IU]/mL — AB (ref <5)

## 2015-08-23 LAB — GC/CHLAMYDIA PROBE AMP (~~LOC~~) NOT AT ARMC
Chlamydia: NEGATIVE
NEISSERIA GONORRHEA: NEGATIVE

## 2015-08-23 NOTE — MAU Provider Note (Signed)
History   [id number]   Chief Complaint  Patient presents with  . Abdominal Pain    HPI Joy Stewart is a 24 y.o. female [phone] here for follow-up BHCG.  Upon review of the records patient was first seen on 08/21/15 for abdominal pain.   BHCG on that day was 499.  Ultrasound showed no IUP or abnormal masses.  Wet prep was collected.  Results were negative.   Pt discharged home.   Pt here today with report of mild lower abdominal pain and no vaginal bleeding.   All other systems negative.      Patient's last menstrual period was 07/24/2015 (exact date).  OB History  Gravida Para Term Preterm AB SAB TAB Ectopic Multiple Living  # Outcome Date GA Lbr Len/2nd Weight Sex Delivery Anes PTL Lv  4 Current           3 Term 07/20/13    Wandalee Ferdinand   Y  2 SAB           1 SAB               Past Medical History  Diagnosis Date  . Medical history non-contributory     Family History  Problem Relation Age of Onset  . Hypertension Father     Social History   Social History  . Marital Status: Single    Spouse Name: N/A  . Number of Children: N/A  . Years of Education: N/A   Social History Main Topics  . Smoking status: Never Smoker   . Smokeless tobacco: None  . Alcohol Use: No  . Drug Use: No  . Sexual Activity: Yes   Other Topics Concern  . None   Social History Narrative    No Known Allergies  No current facility-administered medications on file prior to encounter.   No current outpatient prescriptions on file prior to encounter.     Physical Exam   Filed Vitals:   08/23/15 1402  BP: 98/53  Pulse: 78  Temp: 98.6 F (37 C)  TempSrc: Oral  Resp: 18    Physical Exam  Constitutional: She is oriented to person, place, and time. She appears well-developed and well-nourished. No distress.  HENT:  Head: Normocephalic.  Neck: Neck supple.  Respiratory: Effort normal and breath sounds normal.  Neurological: She is alert and oriented to person,  place, and time. She has normal reflexes.  Skin: Skin is warm and dry.  Psychiatric: She has a normal mood and affect.    MAU Course  Procedures  MDM Results for orders placed or performed during the hospital encounter of 08/23/15 (from the past 24 hour(s))  hCG, quantitative, pregnancy     Status: Abnormal   Collection Time: 08/23/15  2:10 PM  Result Value Ref Range   hCG, Beta Chain, Quant, S 995 (H) <5 mIU/mL     Assessment and Plan  24 y.o. [id number] at [redacted]w[redacted]d wks Pregnancy Follow-up BHCG Pregnancy of Unknown Location  Plan: Follow-up ultrasound in one week Ectopic precautions  Marlis Edelson, CNM 08/23/2015 3:25 PM

## 2015-08-23 NOTE — MAU Note (Signed)
No vaginal bleeding mild lower abdominal pain 4/10

## 2015-08-30 ENCOUNTER — Ambulatory Visit (HOSPITAL_COMMUNITY)
Admit: 2015-08-30 | Discharge: 2015-08-30 | Disposition: A | Discharge status: Home / Self Care | Payer: Medicaid Other | Attending: Family | Admitting: Family

## 2015-08-30 ENCOUNTER — Other Ambulatory Visit (HOSPITAL_COMMUNITY): Payer: Self-pay | Admitting: Family

## 2015-08-30 ENCOUNTER — Inpatient Hospital Stay (HOSPITAL_COMMUNITY)
Admission: AD | Admit: 2015-08-30 | Discharge: 2015-08-30 | Disposition: A | Discharge status: Home / Self Care | Payer: Medicaid Other | Source: Ambulatory Visit | Attending: Obstetrics & Gynecology | Admitting: Obstetrics & Gynecology

## 2015-08-30 DIAGNOSIS — O3680X0 Pregnancy with inconclusive fetal viability, not applicable or unspecified: Secondary | ICD-10-CM

## 2015-08-30 DIAGNOSIS — O26891 Other specified pregnancy related conditions, first trimester: Secondary | ICD-10-CM | POA: Diagnosis not present

## 2015-08-30 NOTE — MAU Provider Note (Signed)
  Ms.Sumaiyah Urias is 24 y.o. female [phone] at [redacted]w[redacted]d presenting to MAU for a follow up US. She had appropriate rising beta hcg levels on 8/23 (499) and 8/25 (995).  Currently she denies pain or bleeding.    US Ob Transvaginal  08/30/2015   CLINICAL DATA:  Assess viability.  EXAM: TRANSVAGINAL OB ULTRASOUND  TECHNIQUE: Transvaginal ultrasound was performed for complete evaluation of the gestation as well as the maternal uterus, adnexal regions, and pelvic cul-de-sac.  COMPARISON:  Ultrasound 08/21/2015  FINDINGS: Intrauterine gestational sac: Visualized/normal in shape.  Yolk sac:  Present  Embryo:  Not present  Cardiac Activity: Not present  MSD: 9.9  mm   5 w   5  d  Maternal uterus/adnexae: Normal right and left ovaries. Corpus luteum within the left ovary. No subchorionic hemorrhage. No free in the pelvis.  IMPRESSION: Early intrauterine gestational sac and yolk sac but no fetal pole or cardiac activity yet visualized. Recommend follow-up quantitative B-HCG levels and follow-up US in 14 days to confirm and assess viability. This recommendation follows SRU consensus guidelines: Diagnostic Criteria for Nonviable Pregnancy Early in the First Trimester. Malva Limes Med 2013; 086:5784-69.   Electronically Signed   By: Annia Belt M.D.   On: 08/30/2015 14:37    Patient encouraged to start prenatal care.  A list of providers given per patient request.    Duane Lope, NP 08/30/2015 3:32 PM

## 2015-10-30 ENCOUNTER — Ambulatory Visit (INDEPENDENT_AMBULATORY_CARE_PROVIDER_SITE_OTHER): Payer: Self-pay | Admitting: Family Medicine

## 2015-10-30 VITALS — BP 92/60 | HR 77 | Temp 98.5°F | Resp 16 | Ht 63.0 in | Wt 109.2 lb

## 2015-10-30 DIAGNOSIS — N912 Amenorrhea, unspecified: Secondary | ICD-10-CM

## 2015-10-30 DIAGNOSIS — Z3490 Encounter for supervision of normal pregnancy, unspecified, unspecified trimester: Secondary | ICD-10-CM

## 2015-10-30 LAB — POCT URINE PREGNANCY: PREG TEST UR: POSITIVE — AB

## 2015-10-30 NOTE — Patient Instructions (Signed)
Folic Acid in Pregnancy  Folic acid is a B vitamin that helps prevent neural tube defects (NTDs). The neural tube is the part of a developing baby that becomes the brain and spinal cord. When the neural tube does not close properly, a baby is born with an NTD. NTDs include spina bifida, hernia of the spinal cord, and the absence of part or all of the brain (anencephaly).   Take folic acid at least 4 weeks before getting pregnant and through the first 3 months of pregnancy. This is when the neural tube is developing. It is available in most multivitamins, as a folic-acid-only supplement, and in some foods. Taking the right amount of folic acid before conception and during pregnancy lessens the chance of having a baby born with an NTD. Giving folic acid will not affect a neural tube defect if it is already present.  DIAGNOSIS   · An alpha fetoprotein (AFP) blood or amniotic fluid test will show high levels of the alpha fetoprotein if a woman is carrying a baby with an NTD. This test is done on all pregnant women in the first trimester.  · An ultrasound may detect an NTD.  WHAT YOU CAN DO:  · Take a multivitamin with at least 0.4 milligrams (400 micrograms) of folic acid daily at least 4 weeks before getting pregnant and through the first 12 weeks of pregnancy.  · If you have already had a pregnancy affected by an NTD, take 4 milligrams (4,000 micrograms) of folic acid daily. Take this amount 1 month before you start trying to get pregnant and continue through the first 3 months of pregnancy.  If you have a seizure disorder or take medicines to control seizures, tell your maternity care provider. Continue to take your folic acid unless you are told otherwise.    FOLIC ACID IN FOODS  Eat a healthy diet that has foods that contain folic acid, the natural form of the vitamin. Such foods include:  · Fortified breakfast cereals.  · Lentils.  · Asparagus.  · Spinach.  · Organ meats (liver).  · Black beans.  · Peanuts (eat  only if you do not have a peanut allergy).  · Broccoli.  · Strawberries, oranges.  · Orange juice (from concentrate is best).  · Enriched breads and pasta.  · Romaine lettuce.  TALK TO YOUR HEALTH CARE PROVIDER IF:  · You are in your first trimester and have high blood sugar.  · You are in your first trimester and develop a high fever.  In almost all cases, a fetus found to have an NTD will need specialized care that may not be available in all hospitals. Talk to your health care provider about what is best for you and your baby.     This information is not intended to replace advice given to you by your health care provider. Make sure you discuss any questions you have with your health care provider.     Document Released: 12/18/2003 Document Revised: 01/05/2015 Document Reviewed: 03/20/2010  Elsevier Interactive Patient Education ©2016 Elsevier Inc.

## 2015-10-30 NOTE — Progress Notes (Signed)
      Chief Complaint:  Chief Complaint  Patient presents with  . Possible Pregnancy    HPI: Joy Stewart is a 24 y.o. female [facility] of: LMP July 19, 2015 EDD April 27 , 2017  [id number] No medicine, not taking prenatal vitamin No nausea   Past Medical History  Diagnosis Date  . Medical history non-contributory    Past Surgical History  Procedure Laterality Date  . Cesarean section     Social History   Social History  . Marital Status: Single    Spouse Name: N/A  . Number of Children: N/A  . Years of Education: N/A   Social History Main Topics  . Smoking status: Never Smoker   . Smokeless tobacco: None  . Alcohol Use: No  . Drug Use: No  . Sexual Activity: Yes   Other Topics Concern  . None   Social History Narrative   Family History  Problem Relation Age of Onset  . Hypertension Father    No Known Allergies Prior to Admission medications   Not on File     ROS: The patient denies fevers, chills, night sweats, unintentional weight loss, chest pain, palpitations, wheezing, dyspnea on exertion, nausea, vomiting, abdominal pain, dysuria, hematuria, melena, numbness, weakness, or tingling.   All other systems have been reviewed and were otherwise negative with the exception of those mentioned in the HPI and as above.    PHYSICAL EXAM: Filed Vitals:   10/30/15 1544  BP: 92/60  Pulse: 77  Temp: 98.5 F (36.9 C)  Resp: 16   Body mass index is 19.35 kg/(m^2).   General: Alert, no acute distress HEENT:  Normocephalic, atraumatic, oropharynx patent. EOMI, PERRLA Cardiovascular:  Regular rate and rhythm, no rubs murmurs or gallops.   Respiratory: Clear to auscultation bilaterally.  No wheezes, rales, or rhonchi.  No cyanosis, no use of accessory musculature Abdominal: No organomegaly, abdomen is soft and non-tender, positive bowel sounds. No masses. Skin: No rashes. Neurologic: Facial musculature symmetric. Psychiatric:  Patient acts appropriately throughout our interaction. Lymphatic: No cervical or submandibular lymphadenopathy Musculoskeletal: Gait intact. No edema, tenderness   LABS: Results for orders placed or performed in visit on 10/30/15  POCT urine pregnancy  Result Value Ref Range   Preg Test, Ur Positive (A) Negative     EKG/XRAY:   [facility]   ASSESSMENT/PLAN: Encounter Diagnoses  Name Primary?  . Amenorrhea   . Pregnant Yes   Advise to take prenatal vitamin Gave names of ob gyn she should call to see if she can get appt and if they accept medicaid.  Fu prn   Gross sideeffects, risk and benefits, and alternatives of medications d/w patient. Patient is aware that all medications have potential sideeffects and we are unable to predict every sideeffect or drug-drug interaction that may occur.  Thao Le DO  10/30/2015 4:53 PM

## 2015-11-24 IMAGING — US US OB COMP LESS 14 WK
1 series · 13 of 28 positions shown · non-contrast
Comparison: None.

CLINICAL DATA: Low abdominal pain for 2 days. Spotting.
her last menstrual period.

EXAM:
OBSTETRIC <14 WK US AND TRANSVAGINAL OB US
TECHNIQUE: Both transabdominal and transvaginal ultrasound examinations were
performed for complete evaluation of the gestation as well as the
maternal uterus, adnexal regions, and pelvic cul-de-sac.
Transvaginal technique was performed to assess early pregnancy.

[Series 1: us ob follow up · 13 of 42 slices shown]
[im 2/42]
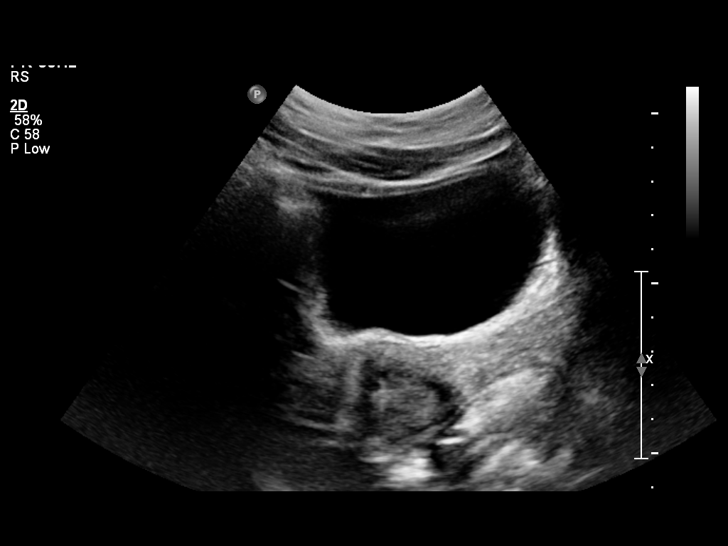
[im 5/42]
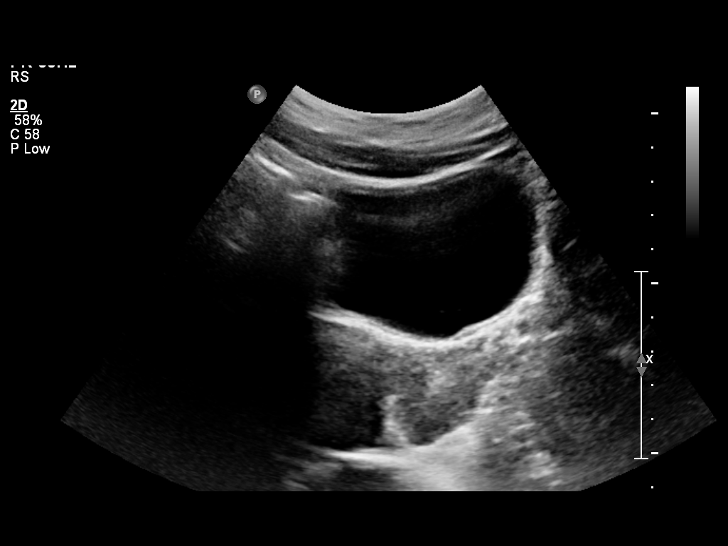
[im 8/42]
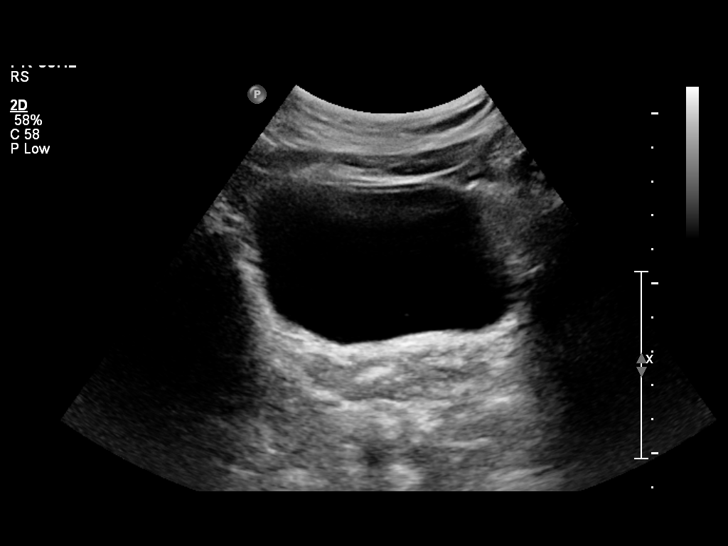
[im 11/42]
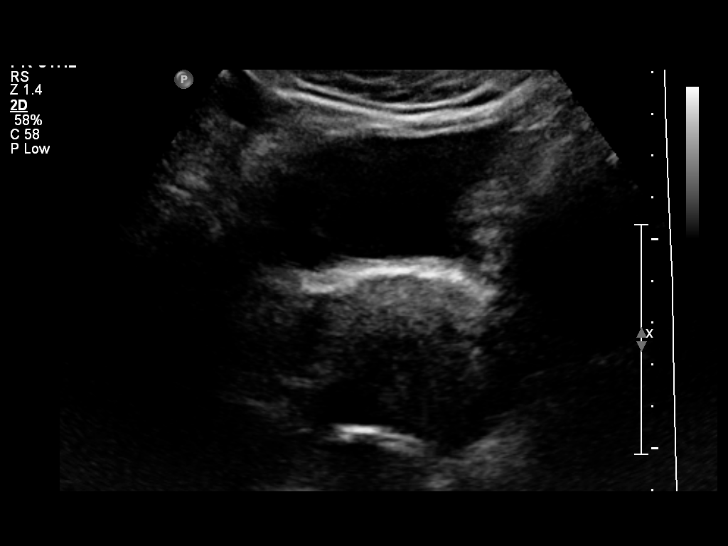
[im 14/42]
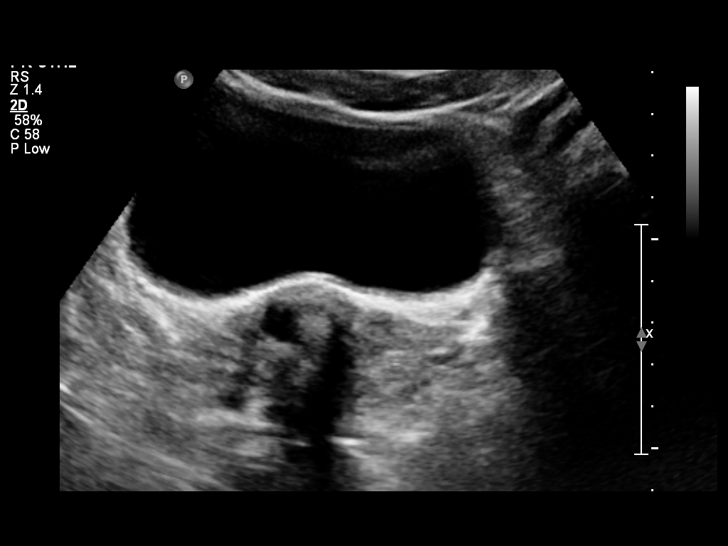
[im 17/42]
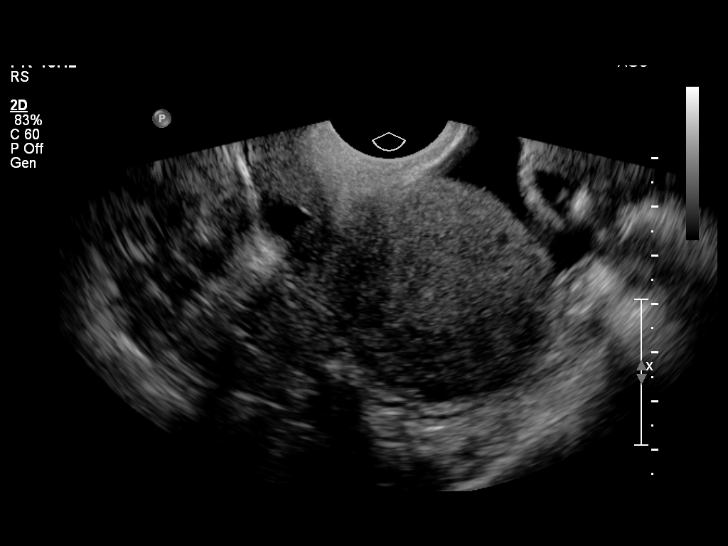
[im 22/42]
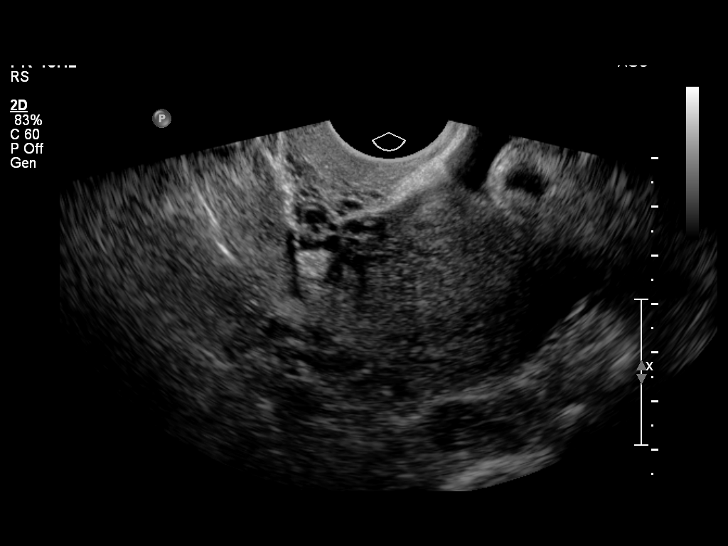
[im 25/42]
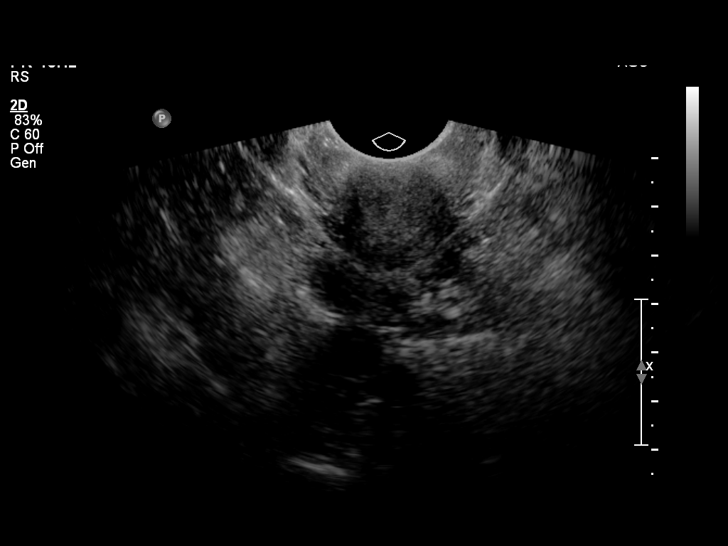
[im 28/42]
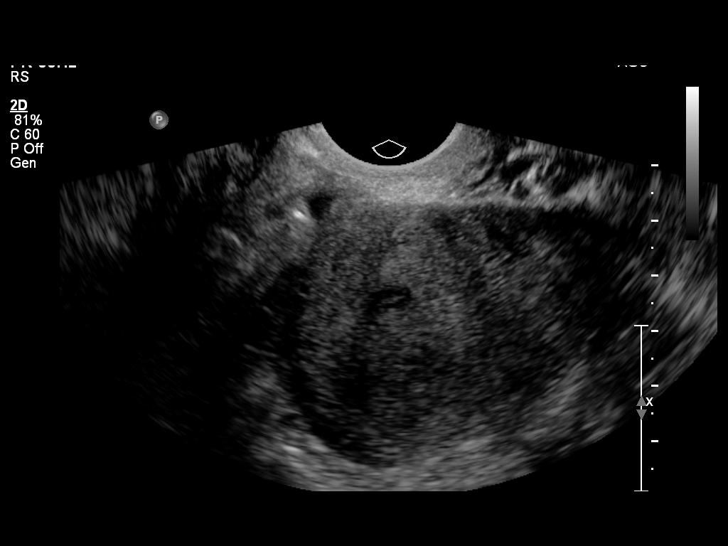
[im 31/42]
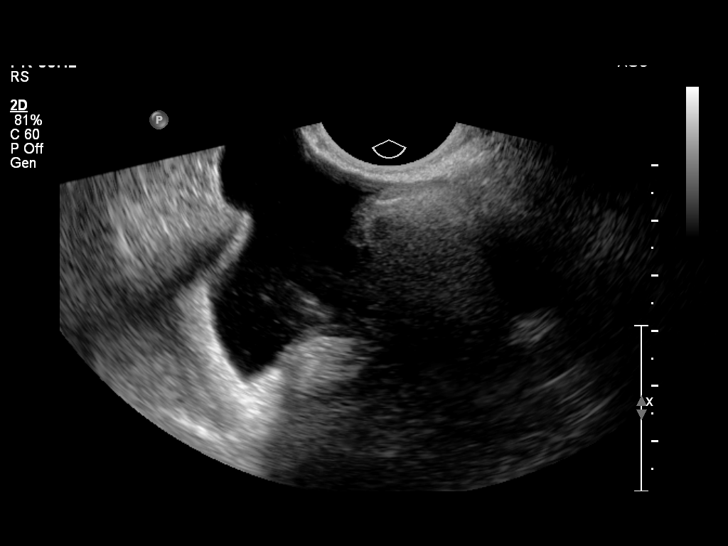
[im 34/42]
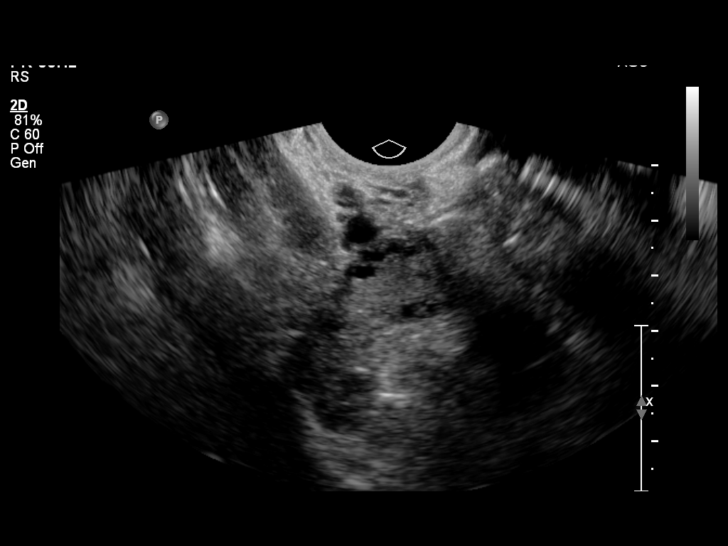
[im 37/42]
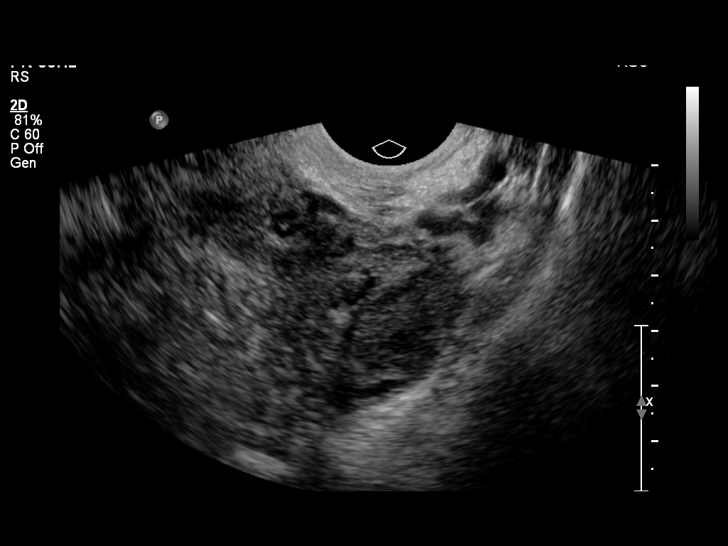
[im 40/42]
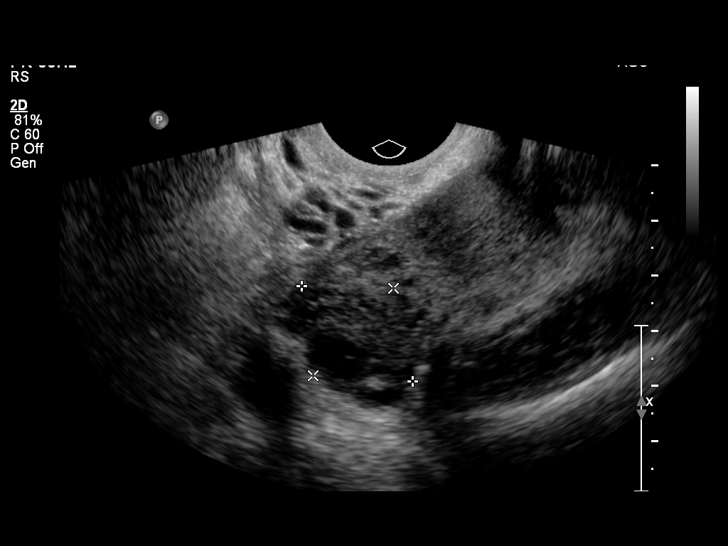

[13 of 28 positions shown; findings below may reference images not displayed]

FINDINGS: Intrauterine gestational sac: Not visualized

Yolk sac:  No

Embryo:  No

Maternal uterus/adnexae: Uterus is retroverted. No uterine masses.
Endometrium is thin and smooth. Ovaries are unremarkable. No adnexal
masses.

There is a small amount pelvic free fluid somewhat greater than
generally seen for physiologic fluid.
IMPRESSION: 1. No sonographic evidence of an intrauterine pregnancy.
2. No ectopic pregnancy seen.
3. Small amount pelvic free fluid.
Given the low beta HCG level, findings still could reflect an early
intrauterine pregnancy not visible at this stage. Ectopic pregnancy
is also not excluded. Findings may reflect a completed miscarriage.
Recommend follow-up serial beta HCG levels and repeat sonographic
imaging in 7-10 days.

## 2015-12-03 IMAGING — US US OB TRANSVAGINAL
1 series · 15 of 27 positions shown · non-contrast
Comparison: Ultrasound 08/21/2015

CLINICAL DATA: Assess viability.

EXAM:
TRANSVAGINAL OB ULTRASOUND
TECHNIQUE: Transvaginal ultrasound was performed for complete evaluation of the
gestation as well as the maternal uterus, adnexal regions, and
pelvic cul-de-sac.

[Series 1: us ob transvaginal · 15 of 27 slices shown]
[im 1/27]
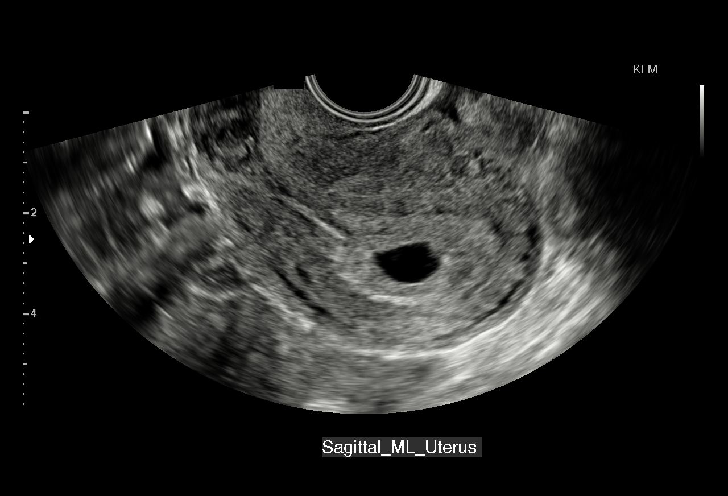
[im 3/27]
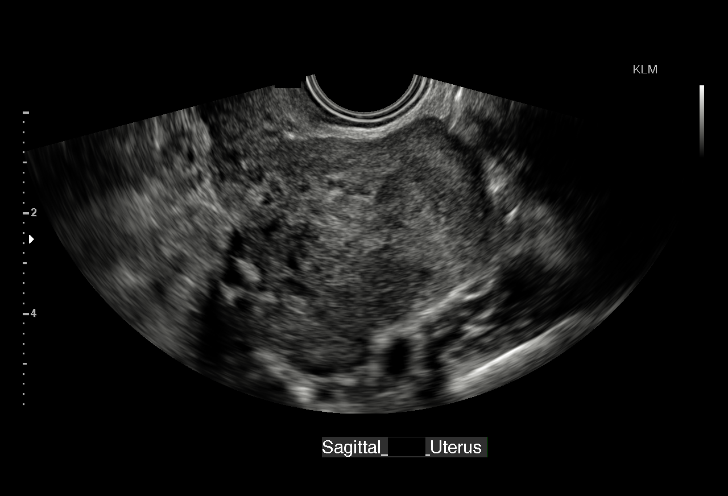
[im 5/27]
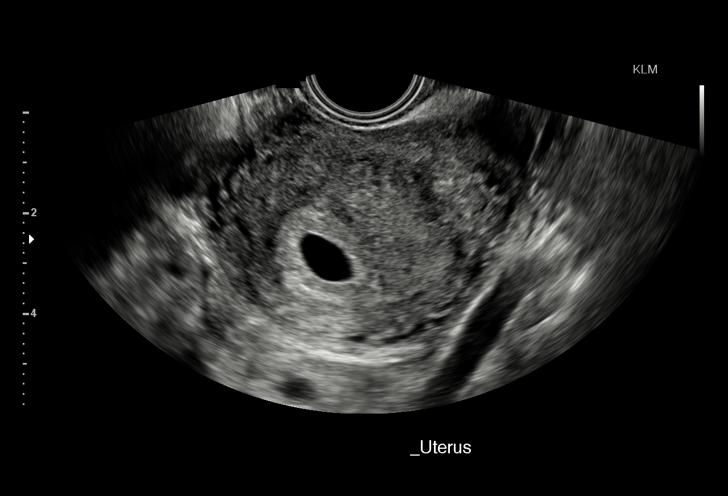
[im 7/27]
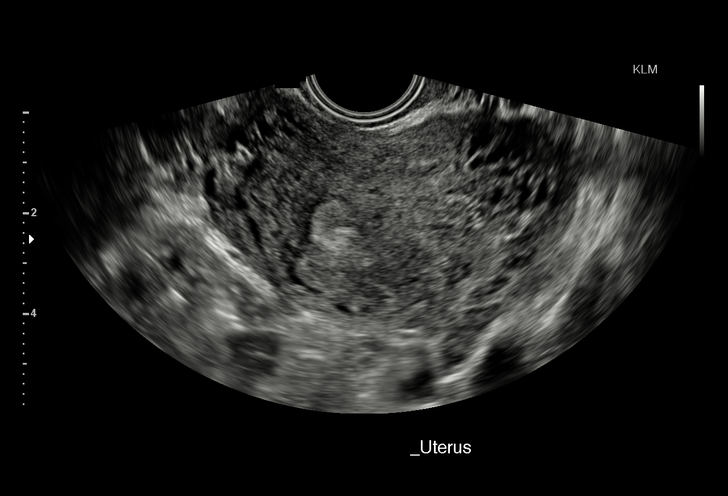
[im 9/27]
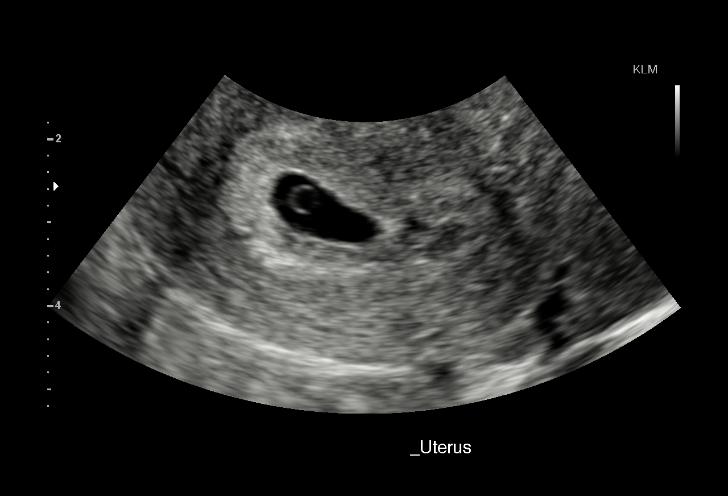
[im 10/27]
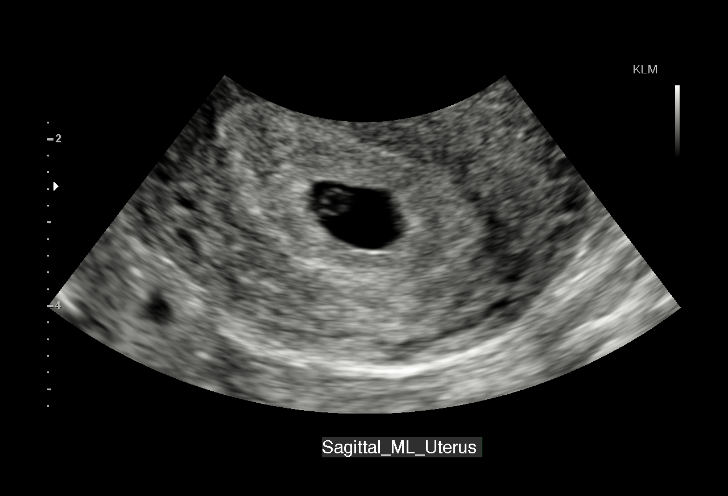
[im 12/27]
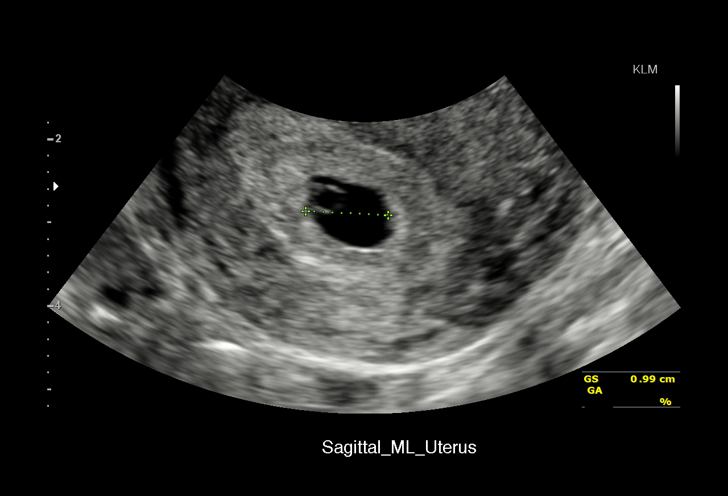
[im 14/27]
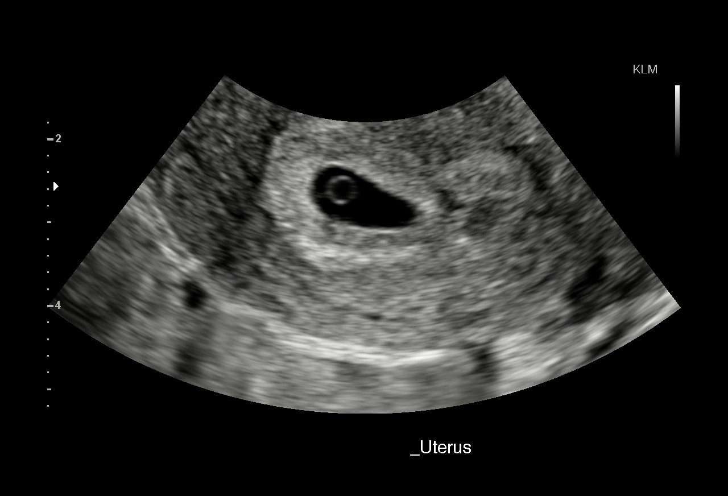
[im 16/27]
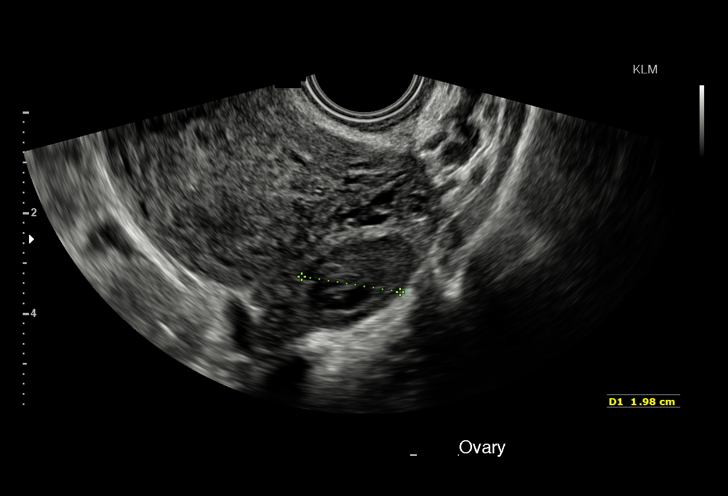
[im 18/27]
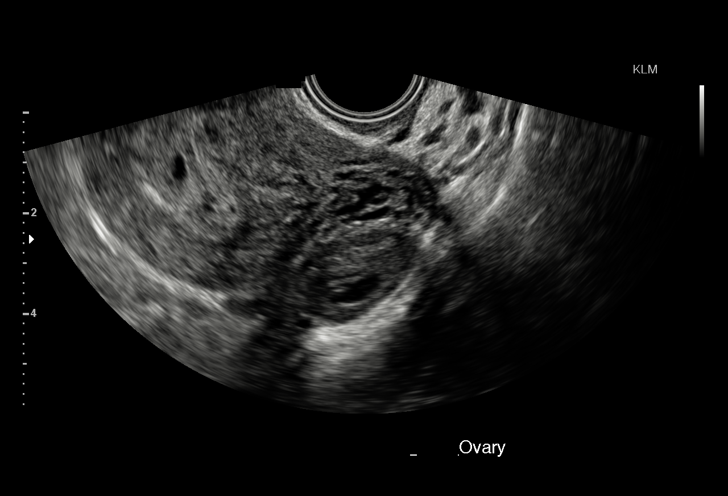
[im 19/27]
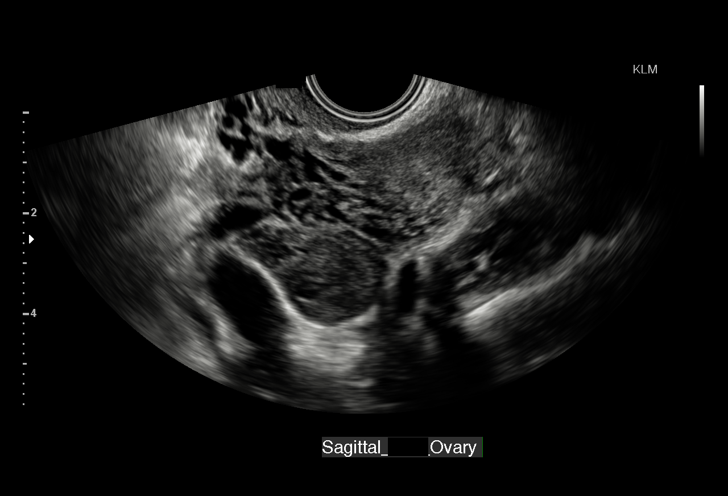
[im 21/27]
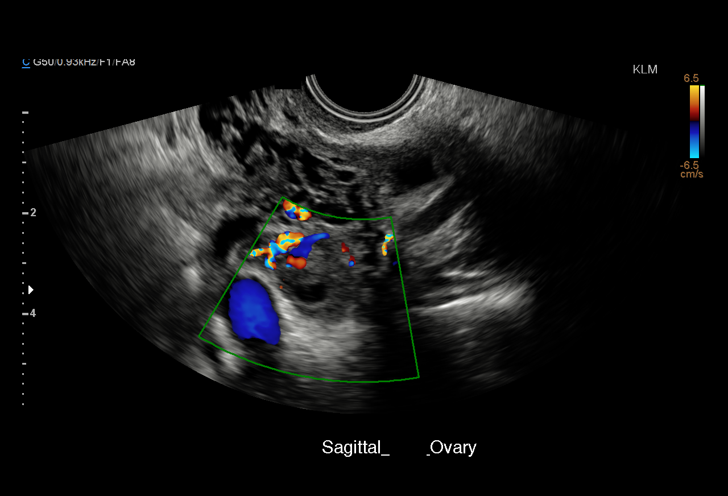
[im 23/27]
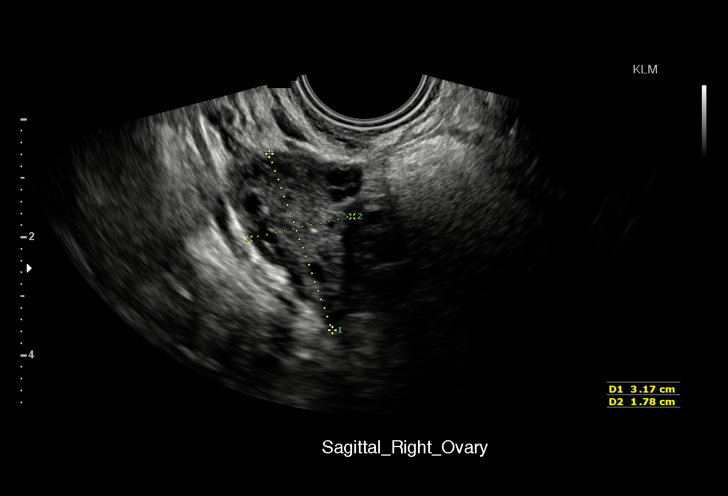
[im 25/27]
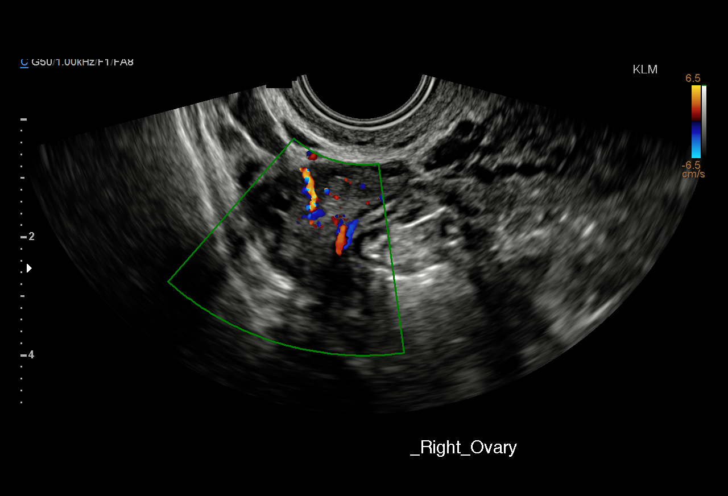
[im 27/27]
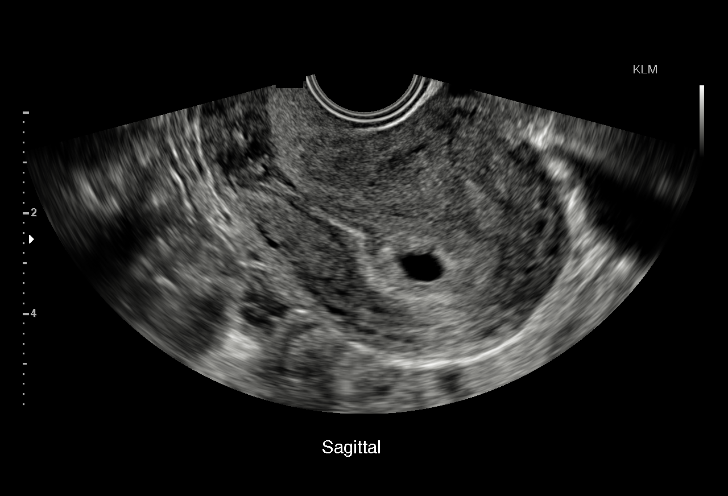

[15 of 27 positions shown; findings below may reference images not displayed]

FINDINGS: Intrauterine gestational sac: Visualized/normal in shape.

Yolk sac:  Present

Embryo:  Not present

Cardiac Activity: Not present

MSD: 9.9  mm   5 w   5  d

Maternal uterus/adnexae: Normal right and left ovaries. Corpus
luteum within the left ovary. No subchorionic hemorrhage. No free in
the pelvis.
IMPRESSION: Early intrauterine gestational sac and yolk sac but no fetal pole or
cardiac activity yet visualized. Recommend follow-up quantitative
B-HCG levels and follow-up US in 14 days to confirm and assess
viability. This recommendation follows SRU consensus guidelines:
Diagnostic Criteria for Nonviable Pregnancy Early in the First
Trimester. N Engl J Med 4102; [DATE].

## 2015-12-18 LAB — OB RESULTS CONSOLE HEPATITIS B SURFACE ANTIGEN: HEP B S AG: POSITIVE

## 2015-12-18 LAB — OB RESULTS CONSOLE HIV ANTIBODY (ROUTINE TESTING): HIV: NONREACTIVE

## 2015-12-18 LAB — OB RESULTS CONSOLE RUBELLA ANTIBODY, IGM: RUBELLA: IMMUNE

## 2015-12-18 LAB — OB RESULTS CONSOLE ABO/RH: RH TYPE: POSITIVE

## 2015-12-18 LAB — OB RESULTS CONSOLE ANTIBODY SCREEN: Antibody Screen: NEGATIVE

## 2015-12-18 LAB — OB RESULTS CONSOLE GC/CHLAMYDIA
CHLAMYDIA, DNA PROBE: NEGATIVE
Gonorrhea: NEGATIVE

## 2015-12-18 LAB — OB RESULTS CONSOLE RPR: RPR: NONREACTIVE

## 2015-12-30 NOTE — L&D Delivery Note (Signed)
Delivery Note At 7:20 AM a viable female was delivered via VBAC, Spontaneous (Presentation: OA ).  APGAR: 9, 9; weight: pending .   Placenta status: Intact, Spontaneous.  Cord: 3 vessels   Anesthesia: Epidural  Episiotomy:  none Lacerations:  Vag abrasions at introitus; repaired w/ figure of 8's for hemostasis Suture Repair: 3.0 vicryl Est. Blood Loss (mL):    Mom to postpartum.  Baby to Couplet care / Skin to Skin.  VAG PACKED W/ SMALL SPONGE FOR OOZING, TO BE REMOVED IN A FEW HOURS  Logyn Kendrick CNM 04/24/2016, 7:56 AM

## 2016-04-02 LAB — OB RESULTS CONSOLE GBS: GBS: NEGATIVE

## 2016-04-09 ENCOUNTER — Other Ambulatory Visit: Payer: Self-pay | Admitting: Obstetrics & Gynecology

## 2016-04-09 ENCOUNTER — Encounter (HOSPITAL_COMMUNITY): Payer: Self-pay

## 2016-04-09 ENCOUNTER — Ambulatory Visit (HOSPITAL_COMMUNITY)
Admission: RE | Admit: 2016-04-09 | Discharge: 2016-04-09 | Disposition: A | Discharge status: Home / Self Care | Payer: Medicaid Other | Source: Ambulatory Visit | Attending: Obstetrics & Gynecology | Admitting: Obstetrics & Gynecology

## 2016-04-09 DIAGNOSIS — Z3A37 37 weeks gestation of pregnancy: Secondary | ICD-10-CM

## 2016-04-09 DIAGNOSIS — O365930 Maternal care for other known or suspected poor fetal growth, third trimester, not applicable or unspecified: Secondary | ICD-10-CM | POA: Insufficient documentation

## 2016-04-09 DIAGNOSIS — IMO0002 Reserved for concepts with insufficient information to code with codable children: Secondary | ICD-10-CM

## 2016-04-09 DIAGNOSIS — O34219 Maternal care for unspecified type scar from previous cesarean delivery: Secondary | ICD-10-CM | POA: Diagnosis not present

## 2016-04-09 DIAGNOSIS — Z3689 Encounter for other specified antenatal screening: Secondary | ICD-10-CM

## 2016-04-10 ENCOUNTER — Other Ambulatory Visit (HOSPITAL_COMMUNITY): Payer: Self-pay | Admitting: *Deleted

## 2016-04-14 ENCOUNTER — Other Ambulatory Visit (HOSPITAL_COMMUNITY): Payer: Self-pay

## 2016-04-15 ENCOUNTER — Other Ambulatory Visit (HOSPITAL_COMMUNITY): Payer: Self-pay | Admitting: *Deleted

## 2016-04-15 DIAGNOSIS — IMO0002 Reserved for concepts with insufficient information to code with codable children: Secondary | ICD-10-CM

## 2016-04-16 ENCOUNTER — Ambulatory Visit (HOSPITAL_COMMUNITY): Payer: Medicaid Other

## 2016-04-22 ENCOUNTER — Ambulatory Visit (HOSPITAL_COMMUNITY): Payer: Medicaid Other

## 2016-04-24 ENCOUNTER — Inpatient Hospital Stay (HOSPITAL_COMMUNITY)
Admission: AD | Admit: 2016-04-24 | Discharge: 2016-04-25 | DRG: 774 | Disposition: A | Discharge status: Home / Self Care | Payer: Medicaid Other | Source: Ambulatory Visit | Attending: Obstetrics & Gynecology | Admitting: Obstetrics & Gynecology

## 2016-04-24 ENCOUNTER — Inpatient Hospital Stay (HOSPITAL_COMMUNITY): Payer: Medicaid Other | Admitting: Anesthesiology

## 2016-04-24 ENCOUNTER — Encounter (HOSPITAL_COMMUNITY): Payer: Self-pay | Admitting: *Deleted

## 2016-04-24 DIAGNOSIS — IMO0001 Reserved for inherently not codable concepts without codable children: Secondary | ICD-10-CM

## 2016-04-24 DIAGNOSIS — O9842 Viral hepatitis complicating childbirth: Secondary | ICD-10-CM | POA: Diagnosis present

## 2016-04-24 DIAGNOSIS — B1910 Unspecified viral hepatitis B without hepatic coma: Secondary | ICD-10-CM | POA: Diagnosis present

## 2016-04-24 DIAGNOSIS — Z8249 Family history of ischemic heart disease and other diseases of the circulatory system: Secondary | ICD-10-CM

## 2016-04-24 DIAGNOSIS — O34211 Maternal care for low transverse scar from previous cesarean delivery: Secondary | ICD-10-CM | POA: Diagnosis present

## 2016-04-24 DIAGNOSIS — Z3A39 39 weeks gestation of pregnancy: Secondary | ICD-10-CM | POA: Diagnosis not present

## 2016-04-24 LAB — CBC
HCT: 36.2 % (ref 36.0–46.0)
Hemoglobin: 12.5 g/dL (ref 12.0–15.0)
MCH: 33.1 pg (ref 26.0–34.0)
MCHC: 34.5 g/dL (ref 30.0–36.0)
MCV: 95.8 fL (ref 78.0–100.0)
PLATELETS: 220 10*3/uL (ref 150–400)
RBC: 3.78 MIL/uL — AB (ref 3.87–5.11)
RDW: 13.2 % (ref 11.5–15.5)
WBC: 12.6 10*3/uL — ABNORMAL HIGH (ref 4.0–10.5)

## 2016-04-24 LAB — RPR: RPR: NONREACTIVE

## 2016-04-24 LAB — TYPE AND SCREEN
ABO/RH(D): A POS
Antibody Screen: NEGATIVE

## 2016-04-24 LAB — ABO/RH: ABO/RH(D): A POS

## 2016-04-24 MED ORDER — PHENYLEPHRINE 40 MCG/ML (10ML) SYRINGE FOR IV PUSH (FOR BLOOD PRESSURE SUPPORT): 80.0000 ug | PREFILLED_SYRINGE | INTRAVENOUS | Status: DC | PRN

## 2016-04-24 MED ORDER — ONDANSETRON HCL 4 MG/2ML IJ SOLN: 4.0000 mg | INTRAMUSCULAR | Status: DC | PRN

## 2016-04-24 MED ORDER — WITCH HAZEL-GLYCERIN EX PADS
1.0000 "application " | MEDICATED_PAD | CUTANEOUS | Status: DC | PRN
  Administered 2016-04-24: 1 via TOPICAL

## 2016-04-24 MED ORDER — ONDANSETRON HCL 4 MG/2ML IJ SOLN: 4.0000 mg | Freq: Four times a day (QID) | INTRAMUSCULAR | Status: DC | PRN

## 2016-04-24 MED ORDER — COCONUT OIL OIL: 1.0000 "application " | TOPICAL_OIL | Status: DC | PRN

## 2016-04-24 MED ORDER — LIDOCAINE HCL (PF) 1 % IJ SOLN
30.0000 mL | INTRAMUSCULAR | Status: DC | PRN
  Filled 2016-04-24: qty 30

## 2016-04-24 MED ORDER — LIDOCAINE HCL (PF) 1 % IJ SOLN
INTRAMUSCULAR | Status: DC | PRN
  Administered 2016-04-24 (×2): 4 mL

## 2016-04-24 MED ORDER — ACETAMINOPHEN 325 MG PO TABS: 650.0000 mg | ORAL_TABLET | ORAL | Status: DC | PRN

## 2016-04-24 MED ORDER — PHENYLEPHRINE 40 MCG/ML (10ML) SYRINGE FOR IV PUSH (FOR BLOOD PRESSURE SUPPORT)
PREFILLED_SYRINGE | INTRAVENOUS | Status: AC
  Filled 2016-04-24: qty 20

## 2016-04-24 MED ORDER — FENTANYL 2.5 MCG/ML BUPIVACAINE 1/10 % EPIDURAL INFUSION (WH - ANES): 14.0000 mL/h | INTRAMUSCULAR | Status: DC | PRN

## 2016-04-24 MED ORDER — SENNOSIDES-DOCUSATE SODIUM 8.6-50 MG PO TABS
2.0000 | ORAL_TABLET | ORAL | Status: DC
  Administered 2016-04-24: 2 via ORAL
  Filled 2016-04-24: qty 2

## 2016-04-24 MED ORDER — OXYTOCIN BOLUS FROM INFUSION: 500.0000 mL | INTRAVENOUS | Status: DC

## 2016-04-24 MED ORDER — CITRIC ACID-SODIUM CITRATE 334-500 MG/5ML PO SOLN: 30.0000 mL | ORAL | Status: DC | PRN

## 2016-04-24 MED ORDER — LACTATED RINGERS IV SOLN: 500.0000 mL | INTRAVENOUS | Status: DC | PRN

## 2016-04-24 MED ORDER — DIPHENHYDRAMINE HCL 50 MG/ML IJ SOLN: 12.5000 mg | INTRAMUSCULAR | Status: DC | PRN

## 2016-04-24 MED ORDER — SIMETHICONE 80 MG PO CHEW: 80.0000 mg | CHEWABLE_TABLET | ORAL | Status: DC | PRN

## 2016-04-24 MED ORDER — FENTANYL CITRATE (PF) 100 MCG/2ML IJ SOLN: 100.0000 ug | INTRAMUSCULAR | Status: DC | PRN

## 2016-04-24 MED ORDER — EPHEDRINE 5 MG/ML INJ: 10.0000 mg | INTRAVENOUS | Status: DC | PRN

## 2016-04-24 MED ORDER — DIPHENHYDRAMINE HCL 25 MG PO CAPS: 25.0000 mg | ORAL_CAPSULE | Freq: Four times a day (QID) | ORAL | Status: DC | PRN

## 2016-04-24 MED ORDER — PRENATAL MULTIVITAMIN CH
1.0000 | ORAL_TABLET | Freq: Every day | ORAL | Status: DC
  Administered 2016-04-24 – 2016-04-25 (×2): 1 via ORAL
  Filled 2016-04-24 (×2): qty 1

## 2016-04-24 MED ORDER — BENZOCAINE-MENTHOL 20-0.5 % EX AERO
1.0000 "application " | INHALATION_SPRAY | CUTANEOUS | Status: DC | PRN
  Administered 2016-04-24: 1 via TOPICAL
  Filled 2016-04-24: qty 56

## 2016-04-24 MED ORDER — LACTATED RINGERS IV SOLN
INTRAVENOUS | Status: DC
  Administered 2016-04-24 (×2): via INTRAVENOUS

## 2016-04-24 MED ORDER — ONDANSETRON HCL 4 MG PO TABS: 4.0000 mg | ORAL_TABLET | ORAL | Status: DC | PRN

## 2016-04-24 MED ORDER — TETANUS-DIPHTH-ACELL PERTUSSIS 5-2.5-18.5 LF-MCG/0.5 IM SUSP: 0.5000 mL | Freq: Once | INTRAMUSCULAR | Status: DC

## 2016-04-24 MED ORDER — OXYTOCIN 10 UNIT/ML IJ SOLN
2.5000 [IU]/h | INTRAVENOUS | Status: DC
  Administered 2016-04-24: 39.96 [IU]/h via INTRAVENOUS
  Administered 2016-04-24: 08:00:00 via INTRAVENOUS
  Filled 2016-04-24: qty 4

## 2016-04-24 MED ORDER — OXYCODONE-ACETAMINOPHEN 5-325 MG PO TABS: 2.0000 | ORAL_TABLET | ORAL | Status: DC | PRN

## 2016-04-24 MED ORDER — LACTATED RINGERS IV SOLN: 500.0000 mL | Freq: Once | INTRAVENOUS | Status: AC

## 2016-04-24 MED ORDER — OXYCODONE-ACETAMINOPHEN 5-325 MG PO TABS: 1.0000 | ORAL_TABLET | ORAL | Status: DC | PRN

## 2016-04-24 MED ORDER — DIBUCAINE 1 % RE OINT: 1.0000 "application " | TOPICAL_OINTMENT | RECTAL | Status: DC | PRN

## 2016-04-24 MED ORDER — IBUPROFEN 600 MG PO TABS
600.0000 mg | ORAL_TABLET | Freq: Four times a day (QID) | ORAL | Status: DC
  Administered 2016-04-24 – 2016-04-25 (×6): 600 mg via ORAL
  Filled 2016-04-24 (×6): qty 1

## 2016-04-24 MED ORDER — FENTANYL 2.5 MCG/ML BUPIVACAINE 1/10 % EPIDURAL INFUSION (WH - ANES)
INTRAMUSCULAR | Status: AC
  Administered 2016-04-24: 12.5 mL/h via EPIDURAL
  Filled 2016-04-24: qty 125

## 2016-04-24 MED ORDER — ZOLPIDEM TARTRATE 5 MG PO TABS: 5.0000 mg | ORAL_TABLET | Freq: Every evening | ORAL | Status: DC | PRN

## 2016-04-24 MED ORDER — FLEET ENEMA 7-19 GM/118ML RE ENEM: 1.0000 | ENEMA | RECTAL | Status: DC | PRN

## 2016-04-24 NOTE — Anesthesia Procedure Notes (Signed)
Epidural Patient location during procedure: OB  Staffing Anesthesiologist: Trini Christiansen Performed by: anesthesiologist   Preanesthetic Checklist Completed: patient identified, site marked, surgical consent, pre-op evaluation, timeout performed, IV checked, risks and benefits discussed and monitors and equipment checked  Epidural Patient position: sitting Prep: site prepped and draped and DuraPrep Patient monitoring: continuous pulse ox and blood pressure Approach: midline Location: L3-L4 Injection technique: LOR saline  Needle:  Needle type: Tuohy  Needle gauge: 17 G Needle length: 9 cm and 9 Needle insertion depth: 3 cm Catheter type: closed end flexible Catheter size: 19 Gauge Catheter at skin depth: 8 cm Test dose: negative  Assessment Events: blood not aspirated, injection not painful, no injection resistance, negative IV test and no paresthesia  Additional Notes Patient identified. Risks/Benefits/Options discussed with patient including but not limited to bleeding, infection, nerve damage, paralysis, failed block, incomplete pain control, headache, blood pressure changes, nausea, vomiting, reactions to medication both or allergic, itching and postpartum back pain. Confirmed with bedside nurse the patient's most recent platelet count. Confirmed with patient that they are not currently taking any anticoagulation, have any bleeding history or any family history of bleeding disorders. Patient expressed understanding and wished to proceed. All questions were answered. Sterile technique was used throughout the entire procedure. Please see nursing notes for vital signs. Test dose was given through epidural catheter and negative prior to continuing to dose epidural or start infusion. Warning signs of high block given to the patient including shortness of breath, tingling/numbness in hands, complete motor block, or any concerning symptoms with instructions to call for help. Patient was  given instructions on fall risk and not to get out of bed. All questions and concerns addressed with instructions to call with any issues or inadequate analgesia.

## 2016-04-24 NOTE — Progress Notes (Signed)
Patient ID: Joy Stewart, female   DOB: 12/19/1991, 25 y.o.   MRN: [id number]  Comfortable w/ epidural  FHR 150s, +10x10accels, occ mi variables Ctx q 2-3 mins Cx 9/90/-1  TOLAC @ term  Plan to check cx in 1-2 hrs or sooner w/ pressure  Ceaser Ebeling CNM 04/24/2016 5:23 AM

## 2016-04-24 NOTE — Anesthesia Postprocedure Evaluation (Signed)
Anesthesia Post Note  Patient: Alanson PulsChang Reddy  Procedure(s) Performed: * No procedures listed *  Patient location during evaluation: Mother Baby Anesthesia Type: Epidural Level of consciousness: awake, awake and alert, oriented and patient cooperative Pain management: pain level controlled Vital Signs Assessment: post-procedure vital signs reviewed and stable Respiratory status: spontaneous breathing, nonlabored ventilation and respiratory function stable Cardiovascular status: stable Postop Assessment: no headache, no backache, patient able to bend at knees and no signs of nausea or vomiting Anesthetic complications: no     Last Vitals:  Filed Vitals:   04/24/16 0932 04/24/16 1032  BP: 99/56 93/56  Pulse: 75 67  Temp: 36.6 C 36.7 C  Resp: 18 16    Last Pain:  Filed Vitals:   04/24/16 1117  PainSc: 0-No pain   Pain Goal: Patients Stated Pain Goal: 4 (04/24/16 0315)               Ramesses Crampton L

## 2016-04-24 NOTE — H&P (Signed)
Joy Stewart is a 25 y.o. female [phone] @ 39.2wks presenting for eval of labor. Denies leaking; reports + bloody show. No H/A, N/V or VD. [facility] and has been remarkable for 1) prev C/S (due to 'infant w/ cord around neck')- desires TOLAC 2) +HBsAG 3) late to care 4) underweight prepreg  History OB History    Gravida Para Term Preterm AB TAB SAB Ectopic Multiple Living   4 1 1  2  2   1      Past Medical History  Diagnosis Date  . Medical history non-contributory    Past Surgical History  Procedure Laterality Date  . Cesarean section     Family History: family history includes Hypertension in her father. Social History:  reports that she has never smoked. She does not have any smokeless tobacco history on file. She reports that she does not drink alcohol or use illicit drugs.   Prenatal Transfer Tool  Maternal Diabetes: No Genetic Screening: Declined- too late Maternal Ultrasounds/Referrals: Normal Fetal Ultrasounds or other Referrals:  None Maternal Substance Abuse:  No Significant Maternal Medications:  None Significant Maternal Lab Results:  Lab values include: Group B Strep negative, HBsAG positive Other Comments:  None  ROS  Dilation: 5 Effacement (%): 80 Station: -2 Exam by:: K. WeissRN Blood pressure 130/70, pulse 79, temperature 97.7 F (36.5 C), temperature source Oral, resp. rate 18, height 5\' 2"  (1.575 m), weight 57.607 kg (127 lb), last menstrual period 07/24/2015, SpO2 99 %. Exam Physical Exam  Constitutional: She is oriented to person, place, and time. She appears well-developed.  HENT:  Head: Normocephalic.  Neck: Normal range of motion.  Cardiovascular: Normal rate.   Respiratory: Effort normal.  GI:  EFM 140s, +accels, no decels, occ mi variables Ctx q 2-744mins  Musculoskeletal: Normal range of motion.  Neurological: She is alert and oriented to person, place, and time.  Skin: Skin is warm and dry.  Psychiatric: She has  a normal mood and affect. Her behavior is normal. Thought content normal.    Prenatal labs: ABO, Rh: --/--/A POS (04/27 0225) Antibody: PENDING (04/27 0225) Rubella: Immune (12/20 0000) RPR: Nonreactive (12/20 0000)  HBsAg: Positive (12/20 0000)  HIV: Non-reactive (12/20 0000)  GBS: Negative (04/05 0000)   Assessment/Plan: IUP@39 .2wks Early active labor TOLAC GBS neg  [facility] Anticipate SVD Infant to get HBIG   Haadi Santellan CNM 04/24/2016, 3:17 AM

## 2016-04-24 NOTE — Anesthesia Preprocedure Evaluation (Signed)
Anesthesia Evaluation  Patient identified by MRN, date of birth, ID band Patient awake    Reviewed: Allergy & Precautions, NPO status , Patient's Chart, lab work & pertinent test results  History of Anesthesia Complications Negative for: history of anesthetic complications  Airway Mallampati: II  TM Distance: >3 FB Neck ROM: Full    Dental no notable dental hx. (+) Dental Advisory Given   Pulmonary neg pulmonary ROS,    Pulmonary exam normal breath sounds clear to auscultation       Cardiovascular negative cardio ROS Normal cardiovascular exam Rhythm:Regular Rate:Normal     Neuro/Psych negative neurological ROS  negative psych ROS   GI/Hepatic negative GI ROS, Neg liver ROS,   Endo/Other  negative endocrine ROS  Renal/GU negative Renal ROS  negative genitourinary   Musculoskeletal negative musculoskeletal ROS (+)   Abdominal   Peds negative pediatric ROS (+)  Hematology negative hematology ROS (+)   Anesthesia Other Findings   Reproductive/Obstetrics (+) Pregnancy                             Anesthesia Physical Anesthesia Plan  ASA: II  Anesthesia Plan: Epidural   Post-op Pain Management:    Induction:   Airway Management Planned:   Additional Equipment:   Intra-op Plan:   Post-operative Plan:   Informed Consent: I have reviewed the patients History and Physical, chart, labs and discussed the procedure including the risks, benefits and alternatives for the proposed anesthesia with the patient or authorized representative who has indicated his/her understanding and acceptance.   Dental advisory given  Plan Discussed with: CRNA  Anesthesia Plan Comments:         Anesthesia Quick Evaluation  

## 2016-04-24 NOTE — MAU Note (Signed)
Pt reports contractions and bleeding for the last 24 hours.

## 2016-04-25 MED ORDER — IBUPROFEN 600 MG PO TABS: 600.0000 mg | ORAL_TABLET | Freq: Four times a day (QID) | ORAL | Status: AC

## 2016-04-25 MED ORDER — NORGESTIMATE-ETH ESTRADIOL 0.25-35 MG-MCG PO TABS: 1.0000 | ORAL_TABLET | Freq: Every day | ORAL | Status: AC

## 2016-04-25 NOTE — Progress Notes (Signed)
POSTPARTUM PROGRESS NOTE  Post Partum Day 1 Subjective:  Joy Stewart is a 25 y.o. [id number] 3012w2d s/p SVD.  No acute events overnight.  Pt denies problems with ambulating, voiding or po intake.  She denies nausea or vomiting.  Pain is well controlled.  She has had flatus. She has not had bowel movement.  Lochia Small.   Objective: Blood pressure 96/60, pulse 67, temperature 98 F (36.7 C), temperature source Oral, resp. rate 18, height 5\' 3"  (1.6 m), weight 58.06 kg (128 lb), last menstrual period 07/24/2015, SpO2 100 %, unknown if currently breastfeeding.  Physical Exam:  General: alert, cooperative and no distress Lochia:normal flow Chest: CTAB Heart: RRR no m/r/g Abdomen: +BS, soft, nontender,  Uterine Fundus: firm, below umbilicus DVT Evaluation: No calf swelling or tenderness Extremities: no edema   Recent Labs  04/24/16 0225  HGB 12.5  HCT 36.2    Assessment/Plan:  ASSESSMENT: Joy Stewart is a 25 y.o. [id number] 3812w2d s/p SVD, doing well, requesting to go home.   Discharge home   LOS: 1 day   Malka Sohomas Daubert 04/25/2016, 7:31 AM   I have seen and examined this patient and agree the above assessment.  Respiratory effort normal, lochia appropriate, legs negative,  pain level normal.  CRESENZO-DISHMAN,[name] 04/25/2016 7:42 AM

## 2016-04-25 NOTE — Discharge Summary (Signed)
OB Discharge Summary     Patient Name: Joy Stewart DOB: 1991/10/01 MRN: [id number]  Date of admission: 04/24/2016 Delivering MD: Cam HaiSHAW, KIMBERLY D   Date of discharge: 04/25/2016  Admitting diagnosis: 40 WEEKS CTX BLEEDING Intrauterine pregnancy: 5765w2d     Secondary diagnosis:  Active Problems:   Active labor  Additional problems: HBV +     Discharge diagnosis: VBAC                                                                                                Post partum procedures:none  Augmentation: none  Complications: None  Hospital course:  Onset of Labor With Vaginal Delivery     25 y.o. yo [id number] at 9465w2d was admitted in Active Labor on 04/24/2016. Patient had an uncomplicated labor course as follows:  Membrane Rupture Time/Date: 6:29 AM ,04/24/2016   Intrapartum Procedures: Episiotomy: None [1]                                         Lacerations:  Vaginal [6]  Patient had a delivery of a Viable infant. 04/24/2016  Information for the patient's newborn:  Estrellita LudwigChen, Girl Janey [[id number]][[id number]]  Delivery Method: VBAC, Spontaneous (Filed from Delivery Summary)     Pateint had an uncomplicated postpartum course.  She is ambulating, tolerating a regular diet, passing flatus, and urinating well. Patient is discharged home in stable condition on 04/25/2016.    Physical exam  Filed Vitals:   04/24/16 1032 04/24/16 1435 04/24/16 1800 04/25/16 0548  BP: 93/56 104/58 102/67 96/60  Pulse: 67 68 89 67  Temp: 98.1 F (36.7 C) 98.3 F (36.8 C) 98.3 F (36.8 C) 98 F (36.7 C)  TempSrc: Oral Oral Oral Oral  Resp: 16 16 12 18   Height:      Weight:      SpO2:   100%    General: alert, cooperative and no distress Lochia: appropriate Uterine Fundus: firm Incision: N/A DVT Evaluation: No evidence of DVT seen on physical exam. Labs: Lab Results  Component Value Date   WBC 12.6* 04/24/2016   HGB 12.5 04/24/2016   HCT 36.2 04/24/2016   MCV 95.8 04/24/2016   PLT 220 04/24/2016    CMP Latest Ref Rng 10/15/2014  Glucose 70 - 99 mg/dL 82  BUN 6 - 23 mg/dL 11  Creatinine 4.740.50 - 2.591.10 mg/dL 5.63(O0.48(L)  Sodium 756137 - 433147 mEq/L 141  Potassium 3.7 - 5.3 mEq/L 4.2  Chloride 96 - 112 mEq/L 103  CO2 19 - 32 mEq/L 25  Calcium 8.4 - 10.5 mg/dL 9.0    Discharge instruction: per After Visit Summary and "Baby and Me Booklet".  After visit meds:    Medication List    TAKE these medications        ibuprofen 600 MG tablet  Commonly known as:  ADVIL,MOTRIN  Take 1 tablet (600 mg total) by mouth every 6 (six) hours.     norgestimate-ethinyl estradiol 0.25-35 MG-MCG tablet  Commonly known as:  ORTHO-CYCLEN,SPRINTEC,PREVIFEM  Take 1 tablet by mouth daily. Start on the Sunday after the baby turns 39 weeks old     prenatal vitamin w/FE, FA 27-1 MG Tabs tablet  Take 1 tablet by mouth daily at 12 noon.        Diet: routine diet  Activity: Advance as tolerated. Pelvic rest for 6 weeks.   Outpatient follow up:6 weeks Follow up Appt:No future appointments. Follow up Visit:No Follow-up on file.  Postpartum contraception: Combination OCPs; rx sent; pt informed to start on the Sunday after the baby turns 76 weeks old, and use condoms for 3 weeks afterwards.  Verbalized understanding  Newborn Data: Live born female  Birth Weight: 6 lb 1 oz (2750 g) APGAR: 9, 9  Baby Feeding: Bottle Disposition:home with mother   04/25/2016 Greig Right, CNM

## 2016-04-25 NOTE — Discharge Instructions (Signed)
